# Patient Record
Sex: Female | Born: 1985 | Race: Black or African American | Hispanic: No | Marital: Single | Smoking: Never smoker
Health system: Southern US, Community
[De-identification: ages and names within clinical notes are randomized; demographics above are authoritative.]

## PROBLEM LIST (undated history)

## (undated) DIAGNOSIS — D649 Anemia, unspecified: Secondary | ICD-10-CM

## (undated) DIAGNOSIS — J189 Pneumonia, unspecified organism: Secondary | ICD-10-CM

## (undated) DIAGNOSIS — N390 Urinary tract infection, site not specified: Secondary | ICD-10-CM

---

## 2007-09-03 ENCOUNTER — Emergency Department (HOSPITAL_COMMUNITY): Admission: EM | Admit: 2007-09-03 | Discharge: 2007-09-03 | Payer: Self-pay | Admitting: Emergency Medicine

## 2009-03-12 ENCOUNTER — Emergency Department (HOSPITAL_COMMUNITY): Admission: EM | Admit: 2009-03-12 | Discharge: 2009-03-12 | Payer: Self-pay | Admitting: Emergency Medicine

## 2010-12-23 ENCOUNTER — Ambulatory Visit (HOSPITAL_COMMUNITY)
Admission: RE | Admit: 2010-12-23 | Discharge: 2010-12-23 | Payer: Self-pay | Source: Home / Self Care | Attending: Obstetrics and Gynecology | Admitting: Obstetrics and Gynecology

## 2011-03-09 LAB — COMPREHENSIVE METABOLIC PANEL
BUN: 8 mg/dL (ref 6–23)
CO2: 26 mEq/L (ref 19–32)
Chloride: 109 mEq/L (ref 96–112)
Creatinine, Ser: 0.67 mg/dL (ref 0.4–1.2)
GFR calc non Af Amer: >60 mL/min (ref >60)
Glucose, Bld: 107 mg/dL — ABNORMAL HIGH (ref 70–99)
Total Bilirubin: 0.6 mg/dL (ref 0.3–1.2)

## 2011-03-09 LAB — URINALYSIS, ROUTINE W REFLEX MICROSCOPIC
Ketones, ur: 15 mg/dL — AB
Leukocytes, UA: NEGATIVE
Nitrite: NEGATIVE
Protein, ur: 30 mg/dL — AB
Urobilinogen, UA: 1 mg/dL (ref 0.0–1.0)
pH: 6 (ref 5.0–8.0)

## 2011-03-09 LAB — DIFFERENTIAL
Basophils Absolute: 0 10*3/uL (ref 0.0–0.1)
Basophils Relative: 0 % (ref 0–1)
Eosinophils Relative: 0 % (ref 0–5)
Lymphocytes Relative: 4 % — ABNORMAL LOW (ref 12–46)
Neutro Abs: 5.4 10*3/uL (ref 1.7–7.7)

## 2011-03-09 LAB — URINE MICROSCOPIC-ADD ON

## 2011-03-09 LAB — CBC
HCT: 38.3 % (ref 36.0–46.0)
Hemoglobin: 13 g/dL (ref 12.0–15.0)
MCV: 85.7 fL (ref 78.0–100.0)
RBC: 4.46 MIL/uL (ref 3.87–5.11)
WBC: 6 10*3/uL (ref 4.0–10.5)

## 2011-03-29 DIAGNOSIS — D649 Anemia, unspecified: Secondary | ICD-10-CM

## 2011-03-29 DIAGNOSIS — J189 Pneumonia, unspecified organism: Secondary | ICD-10-CM

## 2011-03-29 HISTORY — DX: Anemia, unspecified: D64.9

## 2011-03-29 HISTORY — DX: Pneumonia, unspecified organism: J18.9

## 2011-04-14 ENCOUNTER — Inpatient Hospital Stay (HOSPITAL_COMMUNITY)
Admission: AD | Admit: 2011-04-14 | Discharge: 2011-04-14 | Disposition: A | Discharge status: Home / Self Care | Payer: Medicaid Other | Source: Ambulatory Visit | Attending: Obstetrics and Gynecology | Admitting: Obstetrics and Gynecology

## 2011-04-14 DIAGNOSIS — R030 Elevated blood-pressure reading, without diagnosis of hypertension: Secondary | ICD-10-CM

## 2011-04-14 DIAGNOSIS — O99891 Other specified diseases and conditions complicating pregnancy: Secondary | ICD-10-CM | POA: Insufficient documentation

## 2011-04-14 DIAGNOSIS — O9989 Other specified diseases and conditions complicating pregnancy, childbirth and the puerperium: Secondary | ICD-10-CM

## 2011-04-14 LAB — COMPREHENSIVE METABOLIC PANEL
ALT: 15 U/L (ref 0–35)
AST: 22 U/L (ref 0–37)
Alkaline Phosphatase: 148 U/L — ABNORMAL HIGH (ref 39–117)
GFR calc Af Amer: >60 mL/min (ref >60)
Glucose, Bld: 82 mg/dL (ref 70–99)
Potassium: 3.6 mEq/L (ref 3.5–5.1)
Sodium: 137 mEq/L (ref 135–145)
Total Protein: 5.9 g/dL — ABNORMAL LOW (ref 6.0–8.3)

## 2011-04-14 LAB — CBC
HCT: 28.2 % — ABNORMAL LOW (ref 36.0–46.0)
Hemoglobin: 8.5 g/dL — ABNORMAL LOW (ref 12.0–15.0)
RDW: 16.3 % — ABNORMAL HIGH (ref 11.5–15.5)
WBC: 10.4 10*3/uL (ref 4.0–10.5)

## 2011-04-14 LAB — URINE MICROSCOPIC-ADD ON

## 2011-04-14 LAB — URINALYSIS, ROUTINE W REFLEX MICROSCOPIC
Glucose, UA: NEGATIVE mg/dL
Hgb urine dipstick: NEGATIVE
Specific Gravity, Urine: >1.03 — ABNORMAL HIGH (ref 1.005–1.030)
Urobilinogen, UA: 2 mg/dL — ABNORMAL HIGH (ref 0.0–1.0)
pH: 6.5 (ref 5.0–8.0)

## 2011-04-14 LAB — LACTATE DEHYDROGENASE: LDH: 195 U/L (ref 94–250)

## 2011-04-17 ENCOUNTER — Inpatient Hospital Stay (HOSPITAL_COMMUNITY)
Admission: AD | Admit: 2011-04-17 | Discharge: 2011-04-21 | DRG: 774 | Disposition: A | Discharge status: Home / Self Care | Payer: Medicaid Other | Source: Ambulatory Visit | Attending: Obstetrics and Gynecology | Admitting: Obstetrics and Gynecology

## 2011-04-17 DIAGNOSIS — IMO0002 Reserved for concepts with insufficient information to code with codable children: Principal | ICD-10-CM | POA: Diagnosis present

## 2011-04-17 LAB — URINALYSIS, DIPSTICK ONLY
Bilirubin Urine: NEGATIVE
Glucose, UA: NEGATIVE mg/dL
Ketones, ur: 15 mg/dL — AB
Nitrite: NEGATIVE
Protein, ur: NEGATIVE mg/dL
Specific Gravity, Urine: 1.025 (ref 1.005–1.030)
Urobilinogen, UA: 1 mg/dL (ref 0.0–1.0)
pH: 6.5 (ref 5.0–8.0)

## 2011-04-17 LAB — COMPREHENSIVE METABOLIC PANEL
AST: 61 U/L — ABNORMAL HIGH (ref 0–37)
Albumin: 2.4 g/dL — ABNORMAL LOW (ref 3.5–5.2)
Alkaline Phosphatase: 145 U/L — ABNORMAL HIGH (ref 39–117)
Alkaline Phosphatase: 150 U/L — ABNORMAL HIGH (ref 39–117)
BUN: 8 mg/dL (ref 6–23)
BUN: 8 mg/dL (ref 6–23)
Chloride: 101 mEq/L (ref 96–112)
Chloride: 100 mEq/L (ref 96–112)
Creatinine, Ser: 0.48 mg/dL (ref 0.4–1.2)
Glucose, Bld: 84 mg/dL (ref 70–99)
Potassium: 5.7 mEq/L — ABNORMAL HIGH (ref 3.5–5.1)
Potassium: 4.1 mEq/L (ref 3.5–5.1)
Sodium: 133 mEq/L — ABNORMAL LOW (ref 135–145)
Total Bilirubin: 0.4 mg/dL (ref 0.3–1.2)
Total Bilirubin: 0.3 mg/dL (ref 0.3–1.2)
Total Protein: 6.4 g/dL (ref 6.0–8.3)

## 2011-04-17 LAB — CBC
Platelets: 320 10*3/uL (ref 150–400)
RBC: 3.69 MIL/uL — ABNORMAL LOW (ref 3.87–5.11)
RDW: 16.5 % — ABNORMAL HIGH (ref 11.5–15.5)
WBC: 9.5 10*3/uL (ref 4.0–10.5)

## 2011-04-18 LAB — CBC
Platelets: 302 10*3/uL (ref 150–400)
RBC: 3.75 MIL/uL — ABNORMAL LOW (ref 3.87–5.11)
RDW: 16.6 % — ABNORMAL HIGH (ref 11.5–15.5)
WBC: 9.8 10*3/uL (ref 4.0–10.5)

## 2011-04-18 LAB — RPR: RPR Ser Ql: NONREACTIVE

## 2011-04-19 LAB — ABO/RH: ABO/RH(D): O POS

## 2011-04-20 LAB — CBC
Hemoglobin: 7.8 g/dL — ABNORMAL LOW (ref 12.0–15.0)
Platelets: 312 10*3/uL (ref 150–400)
RBC: 3.36 MIL/uL — ABNORMAL LOW (ref 3.87–5.11)
WBC: 20.7 10*3/uL — ABNORMAL HIGH (ref 4.0–10.5)

## 2011-04-20 LAB — RPR: RPR Ser Ql: NONREACTIVE

## 2011-04-24 ENCOUNTER — Emergency Department (HOSPITAL_COMMUNITY): Payer: Medicaid Other

## 2011-04-24 ENCOUNTER — Inpatient Hospital Stay (HOSPITAL_COMMUNITY)
Admission: EM | Admit: 2011-04-24 | Discharge: 2011-04-27 | DRG: 195 | Disposition: A | Discharge status: Home / Self Care | Payer: Medicaid Other | Attending: Internal Medicine | Admitting: Internal Medicine

## 2011-04-24 DIAGNOSIS — I10 Essential (primary) hypertension: Secondary | ICD-10-CM | POA: Diagnosis present

## 2011-04-24 DIAGNOSIS — R0609 Other forms of dyspnea: Secondary | ICD-10-CM | POA: Diagnosis present

## 2011-04-24 DIAGNOSIS — R0602 Shortness of breath: Secondary | ICD-10-CM

## 2011-04-24 DIAGNOSIS — J189 Pneumonia, unspecified organism: Principal | ICD-10-CM | POA: Diagnosis present

## 2011-04-24 DIAGNOSIS — D649 Anemia, unspecified: Secondary | ICD-10-CM | POA: Diagnosis present

## 2011-04-24 DIAGNOSIS — R0989 Other specified symptoms and signs involving the circulatory and respiratory systems: Secondary | ICD-10-CM | POA: Diagnosis present

## 2011-04-24 LAB — CBC
HCT: 27.9 % — ABNORMAL LOW (ref 36.0–46.0)
Hemoglobin: 8.7 g/dL — ABNORMAL LOW (ref 12.0–15.0)
MCH: 23.6 pg — ABNORMAL LOW (ref 26.0–34.0)
MCHC: 31.2 g/dL (ref 30.0–36.0)
MCV: 75.6 fL — ABNORMAL LOW (ref 78.0–100.0)
RBC: 3.69 MIL/uL — ABNORMAL LOW (ref 3.87–5.11)

## 2011-04-24 LAB — URINE MICROSCOPIC-ADD ON

## 2011-04-24 LAB — DIFFERENTIAL
Basophils Relative: 0 % (ref 0–1)
Lymphocytes Relative: 7 % — ABNORMAL LOW (ref 12–46)
Lymphs Abs: 0.9 10*3/uL (ref 0.7–4.0)
Monocytes Absolute: 1 10*3/uL (ref 0.1–1.0)
Monocytes Relative: 7 % (ref 3–12)
Neutro Abs: 11.8 10*3/uL — ABNORMAL HIGH (ref 1.7–7.7)
Neutrophils Relative %: 86 % — ABNORMAL HIGH (ref 43–77)

## 2011-04-24 LAB — BASIC METABOLIC PANEL
BUN: 13 mg/dL (ref 6–23)
CO2: 22 mEq/L (ref 19–32)
Calcium: 9.2 mg/dL (ref 8.4–10.5)
Chloride: 101 mEq/L (ref 96–112)
Creatinine, Ser: 0.54 mg/dL (ref 0.4–1.2)
Glucose, Bld: 88 mg/dL (ref 70–99)

## 2011-04-24 LAB — URINALYSIS, ROUTINE W REFLEX MICROSCOPIC
Glucose, UA: NEGATIVE mg/dL
Ketones, ur: NEGATIVE mg/dL
Protein, ur: NEGATIVE mg/dL
pH: 6.5 (ref 5.0–8.0)

## 2011-04-24 LAB — CARDIAC PANEL(CRET KIN+CKTOT+MB+TROPI)
CK, MB: 3.5 ng/mL (ref 0.3–4.0)
Total CK: 354 U/L — ABNORMAL HIGH (ref 7–177)

## 2011-04-25 LAB — VANCOMYCIN, TROUGH: Vancomycin Tr: 14 ug/mL (ref 10.0–20.0)

## 2011-04-25 LAB — COMPREHENSIVE METABOLIC PANEL
ALT: 17 U/L (ref 0–35)
AST: 19 U/L (ref 0–37)
CO2: 25 mEq/L (ref 19–32)
Chloride: 105 mEq/L (ref 96–112)
GFR calc Af Amer: >60 mL/min (ref >60)
GFR calc non Af Amer: >60 mL/min (ref >60)
Glucose, Bld: 84 mg/dL (ref 70–99)
Sodium: 139 mEq/L (ref 135–145)
Total Bilirubin: 0.3 mg/dL (ref 0.3–1.2)

## 2011-04-25 LAB — MAGNESIUM: Magnesium: 2.1 mg/dL (ref 1.5–2.5)

## 2011-04-25 LAB — URINE CULTURE: Culture  Setup Time: 201205271123

## 2011-04-25 LAB — CBC
Hemoglobin: 8.2 g/dL — ABNORMAL LOW (ref 12.0–15.0)
MCH: 23 pg — ABNORMAL LOW (ref 26.0–34.0)
MCHC: 29.9 g/dL — ABNORMAL LOW (ref 30.0–36.0)
MCV: 76.8 fL — ABNORMAL LOW (ref 78.0–100.0)
Platelets: 372 10*3/uL (ref 150–400)
RBC: 3.57 MIL/uL — ABNORMAL LOW (ref 3.87–5.11)

## 2011-04-25 NOTE — Discharge Summary (Signed)
  NAMEBREANNA, [name]             ACCOUNT NO.:  [id number]  MEDICAL RECORD NO.:  [id number]           PATIENT TYPE:  I  LOCATION:  9104                          [facility]ACILITY:  [facility]  PHYSICIAN:  [name], M.D.DATE OF BIRTH:  [birth year]  DATE OF ADMISSION:  04/17/2011 DATE OF DISCHARGE:  04/21/2011                              DISCHARGE SUMMARY   ADMISSION DIAGNOSIS:  Intrauterine pregnancy at 40 weeks with gestational hypertension.  DISCHARGE DIAGNOSIS:  Intrauterine pregnancy at 40 weeks with gestational hypertension.  PROCEDURES:  On May 22, she had a spontaneous vaginal delivery.  HISTORY AND PHYSICAL:  This is a 25 year old gravida 2, para 0-0-1-0 at 40 plus weeks who is admitted by Dr. Ellyn Keller for cervical ripening and induction for term status with gestational hypertension.  Prenatal care complicated by elevated blood pressures and mild proteinuria.  PAST HISTORY:  Significant only for morbid obesity.  OB HISTORY:  Significant for one spontaneous abortion.  PHYSICAL EXAMINATION:  VITAL SIGNS:  Weight is 300+ pounds, blood pressure is slightly elevated. ABDOMEN:  Obese, nontender, and gravid.  Cervix is closed 50 and -3.  HOSPITAL COURSE:  The patient was admitted on the evening of May 20 and had Cervidil placed for cervical ripening.  On morning of May 21, Dr. Ellyn Keller evaluated her.  Cervix was still closed, thick, and high blood pressures 150/90s and a second Cervidil was placed.  On the evening of May 21, she progressed to 280 and -2.  Membranes were ruptured revealing clear fluid and internal monitors were placed and she was started on Pitocin.  Blood pressures remained stable and she was not placed on magnesium.  Throughout the night on the 21st and end of the morning of 22nd, she gradually entered active labor, reached complete, pushed well, and on the early afternoon of May 22 had a vaginal delivery of a viable female infant with Apgar's of 8 and 9 and weight  7 pounds 10 ounces. She delivered through a loose nuchal cord x1 and there was moderate meconium.  Placenta delivered spontaneous was intact.  She had a second- degree laceration repaired with 3-0 Vicryl with added local block and estimated blood loss was 500 mL.  Postpartum, she had no significant complications.  Blood pressure improved.  Predelivery hemoglobin 8.7, postdelivery 7.8.  On postpartum day #2, she was felt to be stable enough for discharge home.  DISCHARGE INSTRUCTIONS:  Regular diet, pelvic rest, follow up in 6 weeks.  MEDICATIONS:  Percocet #20 one to two p.o. q.4-6 h. p.r.n. pain and over- the-counter ibuprofen as needed and she is given our discharge pamphlet.     [name], M.D.     [name]  D:  04/21/2011  T:  04/21/2011  Job:  [id number]  Electronically Signed by [name] M.D. on 04/25/2011 10:19:26 AM

## 2011-04-26 LAB — IRON AND TIBC
Iron: 27 ug/dL — ABNORMAL LOW (ref 42–135)
Saturation Ratios: 6 % — ABNORMAL LOW (ref 20–55)
TIBC: 472 ug/dL — ABNORMAL HIGH (ref 250–470)

## 2011-04-26 LAB — CBC
Hemoglobin: 8.1 g/dL — ABNORMAL LOW (ref 12.0–15.0)
MCHC: 30.3 g/dL (ref 30.0–36.0)
RBC: 3.43 MIL/uL — ABNORMAL LOW (ref 3.87–5.11)
WBC: 9.6 10*3/uL (ref 4.0–10.5)

## 2011-04-26 LAB — FERRITIN: Ferritin: 14 ng/mL (ref 10–291)

## 2011-04-26 NOTE — H&P (Signed)
NAMECHRISTYANNA, [name]             ACCOUNT NO.:  [id number]  MEDICAL RECORD NO.:  [id number]           PATIENT TYPE:  E  LOCATION:  [facility]                         FACILITY:  [facility]  PHYSICIAN:  Hilary Hertz, MD      DATE OF BIRTH:  05-23-1986  DATE OF ADMISSION:  04/24/2011 DATE OF DISCHARGE:                             HISTORY & PHYSICAL   PRIMARY OB PHYSICIAN:  Sherron Monday, MD  CHIEF COMPLAINT:  Shortness of breath.  HISTORY OF PRESENT ILLNESS:  The patient is a 25 year old woman with obesity who delivered a child on Apr 19, 2011, (5 days ago) who presents with 24 hours of shortness of breath.  The patient was discharged from the hospital on Apr 21, 2011.  She had been doing well up until about 12 p.m. yesterday where she reports she has been increasingly short of breath and had been coughing up "mucus."  The patient reports that she also felt hot, but did not have any documented fevers.  She denies dyspnea on exertion, PND, or orthopnea.  She has lower extremity edema, which is chronic for the past 9 months and unchanged.Marland Kitchen  She was treated for preeclampsia during her delivery.  She is also finding it very hard to sleep due to her symptoms.  REVIEW OF SYSTEMS:  Review of 10-organ systems was done and is negative except as stated above in the HPI.  ALLERGIES:  No known drug allergies.  MEDICATIONS:  The patient is currently not taking any medications.  PAST MEDICAL HISTORY:  None.  PAST SURGICAL HISTORY:  The patient had normal spontaneous vaginal delivery on Apr 19, 2011.  SOCIAL HISTORY:  The patient does not smoke, drink, or use any illicit drugs.  FAMILY HISTORY:  No family history of heart disease.  PHYSICAL EXAMINATION:  VITALS:  Blood pressure 163/96, pulse 113, respirations 36, temperature 98.7. GENERAL:  The patient is in mild respiratory distress.  It is difficult for her to form full sentences due to shortness of breath. HEENT:  Mucous membranes  moist. NECK:  JVP is at the level of the clavicle at about 35 degrees. CV:  Tachy.  No murmurs. LUNGS:  Scattered rhonchi throughout.  Tachypneic.  Mildly labored respiratory effort. ABDOMEN:  Soft, nontender, nondistended. EXTREMITIES:  Trace to 1+ bilateral lower extremity edema. SKIN:  No rashes. NEURO:  Nonfocal.  LABORATORY DATA:  White count 13.8, hemoglobin 8.7, platelets 373. Sodium 135, potassium 3.7, chloride 101, bicarb 22, glucose 88, BUN 13, creatinine 0.5.  BNP is 463.  UA:  Large leukocyte esterase, negative nitrite, large blood.  Microscopic:  11-20 wbc's, 21-50 rbc's.  Chest x- ray shows dense fluffy airspace opacification of the lung bases, more prominent on the right, bilateral lower lobe pneumonia, and mild vascular congestion.  EKG:  Sinus, rate 84, no acute ischemic changes.  ASSESSMENT AND PLAN:  The patient is a 25 year old woman with recent spontaneous vaginal delivery 5 days ago who presents with shortness of breath. 1. Shortness of breath is likely due to the patient's bilateral lower     lobe pneumonia, seen on chest x-ray.  Given the patient's recent 4-  day hospitalization for the delivery of her child, this is     considered hospital-acquired pneumonia, and the patient required     treatment with vancomycin and Zosyn.  She has already received     ceftriaxone and azithromycin in the emergency department.  I have     discussed this extensively with her primary obstetrics physician,     Dr. Ellyn Hack; and at this time, we will put the patient on vancomycin     and Zosyn, but this will preclude the patient from breast feeding.     The patient is agreeable to this, and at this time, reports that     she was thinking about discontinuing breast feeding before this     event anyway.  I think that the patient needs broad-spectrum     antibiotics given the extent of her pneumonia on x-ray and her     recent hospitalization putting her at risk for  multidrug-resistant     organisms.  Can also consider the diagnosis of pulmonary embolism     in this patient given her peripartum status.  This could also be peripartum cardiomyopathy, an echo is ordered.  Her BNP is slightly elevated.  Give supplemental O2 as needed 2. Pulmonary congestion.  On the x-ray, this is likely contributing to     the patient's shortness of breath and presentation.  She also has     an elevated BNP.  Constellation of findings are concerning for     possible peripartum cardiomyopathy.  Her cardiac silhouette is     borderline enlarged on her chest x-ray as well.  We will order an     echocardiogram and give the patient empiric small dose of 20 mg IV     Lasix and assess for response and help with her breathing and     respiratory distress. 3. Hypertension.  The patient has a history of gestational     hypertension and preeclampsia.  I have also discussed this with her     primary obstetrics physician, Dr. Ellyn Hack; and at this time, the     patient does not have protein in her urine.  We will check LFTs.     If there is any issue such as seizure, the patient should receive     magnesium.  At this time, we will continue to monitor her blood     pressure and consider starting lisinopril as ACE inhibitors are     safe postpartum. 4. Breast feeding.  As above, the patient needs many treatments at     this time that is unclear how these medications affect breast     feeding.  We will have Pharmacy review her medications.  I have     discussed this extensively with her obstetric physician, Dr. Ellyn Hack     who will have lactation specialist come by and discuss with the     patient.  She may be able to "pump and dump" if she wishes to     continue to breast feed.  At this time, the patient does not feel     strongly about continuing the breast feed, and she will feed her     child with formula.  She may resume breast feeding if she would     like once that the treatment of  her acute issues is complete.  Dr.     Ellyn Hack will see the patient in consultation officially later today. 5. Fluid, electrolytes, nutrition.  Hep-Lock  IV.  Replace electrolytes     as needed.  Put the patient on a regular diet. 6. Prophylaxis.  Lovenox for deep vein thrombosis prophylaxis.  DISPOSITION:  We will admit the patient to [facility] Team 3 for further evaluation and treatment.  Dr. Ellyn Hack from Obstetrics and Gynecology will see the patient later today.          ______________________________ Hilary Hertz, MD    JF/MEDQ  D:  04/24/2011  T:  04/24/2011  Job:  [id number]  Electronically Signed by Hilary Hertz MD on 04/26/2011 06:34:57 PM

## 2011-04-28 NOTE — H&P (Signed)
NAMEEMY, [name]             ACCOUNT NO.:  [id number]  MEDICAL RECORD NO.:  [id number]           PATIENT TYPE:  O  LOCATION:  [facility]                         [facility]AC[facility]  PHYSICIAN:  Sherron Monday, MD        DATE OF BIRTH:  1986/05/31  DATE OF ADMISSION:  04/14/2011 DATE OF DISCHARGE:  04/14/2011                             HISTORY & PHYSICAL   ADMISSION DIAGNOSIS:  Intrauterine pregnancy at 40 weeks with preeclampsia and unfavorable cervix.  PROCEDURE PLANNED:  Induction of labor with Cervidil and AROM and Pitocin.  HISTORY OF PRESENT ILLNESS:  This is a 25 year old G2, P 0-0-1-0 at 40+ weeks for induction of labor given preeclampsia and post-term status. She is somewhat late entry to prenatal care at 15 weeks.  Her blood pressures have been elevated for several weeks and has mild proteinuria of 1+ in the office.  Otherwise, she has had an uncomplicated prenatal course.  Please see her hospital records for complete history.  We discussed at length induction of labor for this patient given her unfavorable cervix status.  She voices understanding and wishes to proceed.  PAST MEDICAL HISTORY:  Not significant except for morbid obesity.  PAST SURGICAL HISTORY:  Not significant.  PAST OB/GYN HISTORY:  She is a G2, P 0-0-1-0 with G1 being a miscarriage in 2007.  She has a history of an abnormal Pap smear with no followup and no history of any sexually transmitted diseases.  MEDICATIONS:  Prenatal vitamins.  ALLERGIES:  No known drug allergies.  No latex allergies.  SOCIAL HISTORY:  Denies alcohol, tobacco, or drug use.  She is in college and single.  FAMILY HISTORY:  Significant for breast cancer, colon cancer, diabetes, heart disease, hypertension, and migraine.  PRENATAL LABORATORY DATA:  O+.  Antibody screen negative.  Hemoglobin 1.6.  Pap smear within normal limits.  Rubella immune.  RPR nonreactive. Hepatitis B surface antigen negative.  HIV negative.  Platelets  305,000. Varicella IgG equivocal.  Hemoglobin electrophoresis within normal limits.  Gonorrhea negative.  Chlamydia negative.  Cystic fibrosis screen negative.  AFP within normal limits.  Glucola of 104.  Group B strep is negative.  Her ultrasound performed at 15 weeks revealed limited normal anatomy and plan was to repeat it in 3-4 with an [facility] of Apr 15, 2011.  Followup anatomy was again limited.  She followed up with Maternal Fetal Medicine and had a normal anatomy scan.  PHYSICAL EXAMINATION:  VITAL SIGNS:  Afebrile, elevated blood pressure. GENERAL:  No apparent distress. CARDIOVASCULAR:  Regular rate and rhythm. LUNGS:  Clear to auscultation bilaterally. ABDOMEN:  Soft, obese, and nontender. EXTREMITIES:  Symmetric and nontender having mild edema. PELVIS:  Her cervix is 0-hour closed, 50% effaced and -3 station.  Fetal heart tones in the office were 150s with her fundal height measuring mildly ahead at 41 weeks.  ASSESSMENT AND PLAN:  This is a 25 year old G2, P 0-0-1-0 at 40+ weeks for induction of labor given postdate status as well as preeclampsia. She voices understanding to risks, benefits, and alternatives to labor induction.  She will receive Cervidil and then artificial rupture of  membranes and Pitocin.  She is group B strep positive.     Sherron Monday, MD     JB/MEDQ  D:  04/15/2011  T:  04/15/2011  Job:  [id number]  Electronically Signed by Sherron Monday MD on 04/28/2011 10:41:28 AM

## 2011-04-29 NOTE — Consult Note (Signed)
[name], [name]             ACCOUNT NO.:  [id number]  MEDICAL RECORD NO.:  [id number]           PATIENT TYPE:  I  LOCATION:  3705                         FACILITY:  [facility]  PHYSICIAN:  Madolyn Frieze. Jens Som, MD, FACCDATE OF BIRTH:  12-10-1985  DATE OF CONSULTATION:  04/24/2011 DATE OF DISCHARGE:                                CONSULTATION   [name] is a very pleasant 25 year old female with no prior cardiac history who I am asked to evaluate for dyspnea.  The patient delivered her first child on May 22.  She states that she did have pedal edema bilaterally in the last trimester .  She also had fatigue but denied any dyspnea on exertion, orthopnea or PND.  She delivered on May 22 and did well for several days other than fatigue.  She began coughing yesterday at noon.  The cough was productive of a yellow sputum.  She also had subjective fever and developed dyspnea on exertion.  She denied orthopnea, PND or pedal edema.  She has been admitted by the primary care service and is being treated for pneumonia.  There is also a question of postpartum cardiomyopathy and Cardiology is asked to further evaluate.  Note, the patient also had 5 minutes of "medium" chest pain at the time of her delivery.  It increased with certain movements and she has had none since.  Her present medications include azithromycin and Rocephin that she received x1 in the emergency room.  She is on 40 mg of enoxaparin subcu daily.  She received Lasix x1.  She is on vancomycin and Zosyn.  She has no known drug allergies.  SOCIAL HISTORY:  She does not smoke.  She does not consume alcohol.  Her family history is negative for coronary artery disease.  Her past medical history, there is no diabetes mellitus, hypertension or hyperlipidemia.  She has had no previous surgeries.  REVIEW OF SYSTEMS:  She denies any headaches but she has had a subjective fever.  There has been no chills.  She has had a  productive cough.  There is no hemoptysis.  There is no dysphagia, odynophagia, melena or hematochezia.  There is no dysuria.  There is no orthopnea or PND and her pedal edema has resolved since delivery.  The remaining systems are negative.  PHYSICAL EXAMINATION:  VITAL SIGNS:  Today shows a temperature of 99.9. Her blood pressure is 152/97.  Her pulse is 94. GENERAL:  She is well developed and obese.  She is in no acute distress at present.  Her skin is warm and dry.  She does not appear to be depressed.  There is no peripheral clubbing. BACK:  Normal. HEENT:  Normal with normal eyelids. NECK:  Supple with a normal upstroke bilaterally.  No bruits noted. There is no thyromegaly and I cannot appreciate bruits.  Her jugular venous distention is difficult to assess. CHEST:  Crackles in the right lower lobe. CARDIOVASCULAR:  Regular rate and rhythm with normal S1 and S2.  I cannot appreciate murmurs, rubs or gallops. ABDOMEN:  Nontender, nondistended.  Positive bowel sounds.  No hepatosplenomegaly.  No mass appreciated.  There is no abdominal bruit. She has 2+ femoral pulses bilaterally.  No bruits. EXTREMITIES:  No edema.  I can palpate no cords.  She has 2+ dorsalis pedis pulses bilaterally. NEUROLOGIC:  Grossly intact.  Her laboratories show a chest x-ray that reveals bilateral lower lobe pneumonia and mild vascular congestion.  Her BNP is mildly elevated at 464.  Her sodium is 135, potassium 3.7.  BUN and creatinine are 13 and 0.54.  White blood cell count is 13.8 with a hemoglobin of 8.7, hematocrit 27.9.  Platelet count is 373.  Her electrocardiogram shows a sinus rhythm at a rate of 84.  There were no ST changes.  DIAGNOSIS:  Dyspnea - this appears to be most likely secondary to pneumonia given her mildly elevated white blood cell count, chest x-ray findings and productive cough.  She is being treated with antibiotics per the primary care service.  Her BNP is mildly elevated  but her pedal edema is resolving.  She may have mild volume excess from her recent pregnancy.  Certainly, peripartum cardiomyopathy is also in the differential.  We will await her echocardiogram.  I think if her LV function is normal then I would recommend continuing therapy for her pneumonia.  Note, she was given Lasix 20 mg intravenously by the primary care service.  Her blood pressure is also mildly elevated and this will be followed.  If she requires therapy for this, I would not use an ACE inhibitor as she does plan to breastfeed.  We will be happy to follow while she is in the hospital.     Madolyn Frieze. Jens Som, MD, [facility]     BSC/MEDQ  D:  04/24/2011  T:  04/24/2011  Job:  [id number]  Electronically Signed by Olga Millers MD [facility] on 04/29/2011 04:54:32 PM

## 2011-04-30 LAB — CULTURE, BLOOD (ROUTINE X 2)
Culture  Setup Time: 201205271418
Culture: NO GROWTH

## 2011-06-29 NOTE — Discharge Summary (Signed)
[name], [name] NO.:  [id number]  MEDICAL RECORD NO.:  [id number]  LOCATION:  3705                         FACILITY:  [facility]  PHYSICIAN:  Calvert Cantor, M.D.     DATE OF BIRTH:  09/09/1986  DATE OF ADMISSION:  04/24/2011 DATE OF DISCHARGE:  04/27/2011                              DISCHARGE SUMMARY   PRIMARY CARE PHYSICIAN:  Sherron Monday, MD  PRESENTING COMPLAINT:  Shortness of breath.  DISCHARGE DIAGNOSES: 1. Pneumonia, healthcare-acquired. 2. Hypertension. 3. Pedal edema on admission. 4. Microcytic anemia due to iron deficiency. 5. Morbid obesity. 6. Anemia of iron deficiency. 7. Vitamin B12 deficiency.  DISCHARGE MEDICATIONS: 1. Augmentin 875 one tablet twice a day. 2. Cyanocobalamin 1000 mcg daily. 3. Doxycycline 100 mg twice a day. 4. Ferrous sulfate 325 mg twice a day before meals. 5. Senna 2 tablets daily as needed for constipation. 6. Prenatal vitamin 1 tablet daily.  PROCEDURES: 1. Chest x-ray two-view on Apr 24, 2011 revealed dense flow free     airspace opacification at the lung bases, more prominent on the     right compatible with bilateral lower lobe pneumonia and mild     vascular congestion. 2. A 2-D echo performed Apr 26, 2011, LV cavity, normal systolic     function, normal EF 60-65%.  No regional wall motion abnormalities.     E/e ratio is greater than 10 suggesting elevated LV filling     pressures.  LV diastolic parameters were normal.  There is trace     mitral regurgitation.  PA pressure is less than 39.  Tricuspid jet     envelope was incomplete.  Inferior vena cava is normal in size.     The respirophasic diameter changes were in the normal range     equalling 50%.  Findings are consistent with normal central venous     pressure.  There was no pericardial effusion.  HOSPITAL COURSE:  This is a 25 year old female who delivered child 5 days ago and presented to the ER with a complaint of shortness of breath.  She  admitted to coughing up mucus.  She felt hot but did not have any documented fevers.  She also complained of lower extremity edema, which she had been dealing with for the past 9 months.  She was treated for preeclampsia during her delivery.  The patient was hospitalized due and started on vancomycin and Zosyn for healthcare-acquired pneumonia.  She progressed well during the hospital stay and shortness of breath improved.  Cardiology consult was requested and 2-D echo was done.  Cardiology did not suspect that her dyspnea was related to congestive heart failure.  Blood pressure was noted to be mildly elevated throughout her stay but no treatment was initiated for this as it was mild.  She was found to be anemic and deficient in iron.  Hemoglobin was noted to be 8.7.  Iron level was low at 27, iron saturation was 6, iron binding was 427 which is high, ferritin was 14.  The patient has been started on above-mentioned iron.  She was also found to be deficient in vitamin B12 level was 129.  On discharge, the patient is doing well  and is now requiring oxygen. Lungs clear bilaterally.  Normal respiratory effort.  Heart, regular rate and rhythm.  No murmurs.  Abdomen, soft, nontender.  Bowel sounds positive.  Extremities, no longer has any edema.  No cyanosis or clubbing.  Condition on discharge is stable.  Follow up with primary care physician in 1-2 weeks.  Follow up with OB per appointment.     Calvert Cantor, M.D.     SR/MEDQ  D:  06/28/2011  T:  06/28/2011  Job:  [id number]  cc:   Sherron Monday, MD  Electronically Signed by Calvert Cantor M.D. on 06/29/2011 07:17:29 AM

## 2011-09-08 LAB — URINE MICROSCOPIC-ADD ON

## 2011-09-08 LAB — POCT PREGNANCY, URINE: Preg Test, Ur: NEGATIVE

## 2011-09-08 LAB — URINALYSIS, ROUTINE W REFLEX MICROSCOPIC
Bilirubin Urine: NEGATIVE
Glucose, UA: NEGATIVE
Nitrite: NEGATIVE
Specific Gravity, Urine: 1.019
pH: 7

## 2011-09-08 LAB — URINE CULTURE

## 2011-10-08 ENCOUNTER — Emergency Department (HOSPITAL_COMMUNITY)
Admission: EM | Admit: 2011-10-08 | Discharge: 2011-10-08 | Disposition: A | Discharge status: Home / Self Care | Payer: Medicaid Other | Attending: Emergency Medicine | Admitting: Emergency Medicine

## 2011-10-08 ENCOUNTER — Emergency Department (HOSPITAL_COMMUNITY): Payer: Medicaid Other

## 2011-10-08 ENCOUNTER — Encounter: Payer: Self-pay | Admitting: *Deleted

## 2011-10-08 DIAGNOSIS — R1011 Right upper quadrant pain: Secondary | ICD-10-CM | POA: Insufficient documentation

## 2011-10-08 DIAGNOSIS — R110 Nausea: Secondary | ICD-10-CM | POA: Insufficient documentation

## 2011-10-08 DIAGNOSIS — R630 Anorexia: Secondary | ICD-10-CM | POA: Insufficient documentation

## 2011-10-08 DIAGNOSIS — R109 Unspecified abdominal pain: Secondary | ICD-10-CM

## 2011-10-08 DIAGNOSIS — E669 Obesity, unspecified: Secondary | ICD-10-CM | POA: Insufficient documentation

## 2011-10-08 DIAGNOSIS — K8020 Calculus of gallbladder without cholecystitis without obstruction: Secondary | ICD-10-CM | POA: Insufficient documentation

## 2011-10-08 DIAGNOSIS — R10819 Abdominal tenderness, unspecified site: Secondary | ICD-10-CM | POA: Insufficient documentation

## 2011-10-08 HISTORY — DX: Pneumonia, unspecified organism: J18.9

## 2011-10-08 LAB — URINE MICROSCOPIC-ADD ON

## 2011-10-08 LAB — COMPREHENSIVE METABOLIC PANEL
Alkaline Phosphatase: 73 U/L (ref 39–117)
BUN: 11 mg/dL (ref 6–23)
Chloride: 105 mEq/L (ref 96–112)
GFR calc Af Amer: >90 mL/min (ref >90)
GFR calc non Af Amer: >90 mL/min (ref >90)
Glucose, Bld: 96 mg/dL (ref 70–99)
Potassium: 3.8 mEq/L (ref 3.5–5.1)
Total Bilirubin: 0.2 mg/dL — ABNORMAL LOW (ref 0.3–1.2)

## 2011-10-08 LAB — URINALYSIS, ROUTINE W REFLEX MICROSCOPIC
Ketones, ur: NEGATIVE mg/dL
Ketones, ur: NEGATIVE mg/dL
Leukocytes, UA: NEGATIVE
Nitrite: NEGATIVE
Nitrite: NEGATIVE
Protein, ur: 30 mg/dL — AB
Specific Gravity, Urine: 1.027 (ref 1.005–1.030)
Urobilinogen, UA: 1 mg/dL (ref 0.0–1.0)
pH: 7 (ref 5.0–8.0)

## 2011-10-08 LAB — CBC
Hemoglobin: 11.4 g/dL — ABNORMAL LOW (ref 12.0–15.0)
MCH: 26.4 pg (ref 26.0–34.0)
RBC: 4.32 MIL/uL (ref 3.87–5.11)

## 2011-10-08 LAB — DIFFERENTIAL
Eosinophils Absolute: 0.1 10*3/uL (ref 0.0–0.7)
Lymphs Abs: 1.2 10*3/uL (ref 0.7–4.0)
Monocytes Relative: 9 % (ref 3–12)
Neutrophils Relative %: 64 % (ref 43–77)

## 2011-10-08 MED ORDER — FENTANYL CITRATE 0.05 MG/ML IJ SOLN
INTRAMUSCULAR | Status: AC
  Filled 2011-10-08: qty 2

## 2011-10-08 MED ORDER — SODIUM CHLORIDE 0.9 % IV BOLUS (SEPSIS)
500.0000 mL | Freq: Once | INTRAVENOUS | Status: AC
  Administered 2011-10-08: 09:00:00 via INTRAVENOUS

## 2011-10-08 MED ORDER — ONDANSETRON HCL 4 MG/2ML IJ SOLN
4.0000 mg | Freq: Once | INTRAMUSCULAR | Status: AC
  Administered 2011-10-08: 4 mg via INTRAVENOUS

## 2011-10-08 MED ORDER — ONDANSETRON HCL 4 MG/2ML IJ SOLN
INTRAMUSCULAR | Status: AC
  Filled 2011-10-08: qty 2

## 2011-10-08 MED ORDER — FENTANYL CITRATE 0.05 MG/ML IJ SOLN
50.0000 ug | Freq: Once | INTRAMUSCULAR | Status: AC
  Administered 2011-10-08: 50 ug via INTRAVENOUS
  Filled 2011-10-08: qty 2

## 2011-10-08 MED ORDER — FENTANYL CITRATE 0.05 MG/ML IJ SOLN
50.0000 ug | Freq: Once | INTRAMUSCULAR | Status: AC
  Administered 2011-10-08: 50 ug via INTRAVENOUS

## 2011-10-08 MED ORDER — SODIUM CHLORIDE 0.9 % IV SOLN: INTRAVENOUS | Status: DC

## 2011-10-08 MED ORDER — ONDANSETRON HCL 4 MG/2ML IJ SOLN
4.0000 mg | Freq: Once | INTRAMUSCULAR | Status: AC
  Administered 2011-10-08: 4 mg via INTRAVENOUS
  Filled 2011-10-08: qty 2

## 2011-10-08 NOTE — ED Notes (Signed)
Pt states she is hurting in her rt upper stomach, some  Nausea,

## 2011-10-08 NOTE — ED Notes (Signed)
Pt presenting to ed with c/o abdominal pain. Pt states positive nausea no vomiting. Pt states she has right side pain as well. Pt states onset 12:00am. Pt denies diarrhea at this time.

## 2011-10-08 NOTE — ED Notes (Signed)
Pt to ultrasound

## 2011-10-08 NOTE — ED Notes (Signed)
Pt medicated per md order for nausea

## 2011-10-08 NOTE — Consult Note (Signed)
Reason for Consult:  abdominal pain and gallstones Referring Physician: Dr. Rubin Payor, EDP  [name] is an 25 y.o. female.  HPI: At midnight, she began having pressure-type epigastric and right upper quadrant pain. This was associated with nausea and vomiting. The pain persisted. She presented to the emergency department for evaluation. She was given pain medicine and medicine for nausea and currently she feels better. She is found to have gallbladder disease, specifically gallstones, on her ultrasound. No evidence of acute cholecystitis. I was asked to see her because of this. No fever. No diarrhea.  She is 6 months postpartum. He began having symptoms like this but less severe postpartum.  Past Medical History  Diagnosis Date  . Pneumonia     bilateral, postpartum    History reviewed. No pertinent past surgical history.  Allergies: No Known Allergies  Medications: Prior to Admission:  (Not in a hospital admission)  Family History  Problem Relation Age of Onset  . Hypertension Mother     Social History:  reports that she has never smoked. She does not have any smokeless tobacco history on file. She reports that she does not drink alcohol or use illicit drugs.  ROS General:  Negative  Breast:  NA  Infectious Diseases: Negative  Cardiac  :  Negative  Pulmonary:  Negative  Endocrine:  Negative  Skin:  Negative  Gastrointestinal:  Negative  Genitourinary:  Negative  Neurological:  Negative  Hematologic/Lymphatic:  Negative  HEENT:  Negative  Musculoskeletal:  Negative   Blood pressure 137/81, pulse 62, temperature 98.3 F (36.8 C), temperature source Oral, resp. rate 20, last menstrual period 10/08/2011, SpO2 100.00%.  BP 137/81  Pulse 62  Temp(Src) 98.3 F (36.8 C) (Oral)  Resp 20  SpO2 100%  LMP 10/08/2011  PE        General Appearance:  Alert, cooperative, no distress, appears stated age, obese  Head:  Normocephalic, without obvious  abnormality, atraumatic  Eyes:   Conjunctiva/corneas clear, EOM's intact      Nose: Nares normal, no drainage   Mouth: Mucous membranes moist  Neck: Supple, symmetrical, trachea midline, no tenderness/mass/nodules, no JVD  Back:   Symmetric, no curvature, no CVA tenderness  Lungs:   Clear to auscultation bilaterally, respirations unlabored  Chest Wall:  No tenderness or deformity  Heart:  Regular rate and rhythm, S1, S2 normal, no murmur, rub or gallop  Abdomen:   Soft, non-tender, bowel sounds active all four quadrants,  no masses, no organomegaly, no scars  GU:  No masses, no hernias  Rectal:  Normal tone, normal prostate, no masses or tenderness; guaiac negative stool  Extremities: Extremities normal, atraumatic, no cyanosis or edema     Skin: Skin color, texture, turgor normal, no rashes or lesions  Lymph nodes: No enlarged cervical or supraclavicular nodes                                  Neuro:  Alert and oriented, no focal deficits          Results for orders placed during the hospital encounter of 10/08/11 (from the past 48 hour(s))  URINALYSIS, ROUTINE W REFLEX MICROSCOPIC     Status: Abnormal   Collection Time   10/08/11  8:51 AM      Component Value Range Comment   Color, Urine YELLOW  YELLOW     Appearance CLOUDY (*) CLEAR     Specific Gravity, Urine  1.030  1.005 - 1.030     pH 7.0  5.0 - 8.0     Glucose, UA NEGATIVE  NEGATIVE (mg/dL)    Hgb urine dipstick LARGE (*) NEGATIVE     Bilirubin Urine NEGATIVE  NEGATIVE     Ketones, ur NEGATIVE  NEGATIVE (mg/dL)    Protein, ur 30 (*) NEGATIVE (mg/dL)    Urobilinogen, UA 1.0  0.0 - 1.0 (mg/dL)    Nitrite NEGATIVE  NEGATIVE     Leukocytes, UA SMALL (*) NEGATIVE    PREGNANCY, URINE     Status: Normal   Collection Time   10/08/11  8:51 AM      Component Value Range Comment   Preg Test, Ur NEGATIVE     URINE MICROSCOPIC-ADD ON     Status: Abnormal   Collection Time   10/08/11  8:51 AM      Component Value Range  Comment   Squamous Epithelial / LPF MANY (*) RARE     WBC, UA 7-10  <3 (WBC/hpf)    RBC / HPF TOO NUMEROUS TO COUNT  <3 (RBC/hpf)    Bacteria, UA RARE  RARE     Urine-Other MUCOUS PRESENT     CBC     Status: Abnormal   Collection Time   10/08/11  9:28 AM      Component Value Range Comment   WBC 4.7  4.0 - 10.5 (K/uL)    RBC 4.32  3.87 - 5.11 (MIL/uL)    Hemoglobin 11.4 (*) 12.0 - 15.0 (g/dL)    HCT 47.8 (*) 29.5 - 46.0 (%)    MCV 82.4  78.0 - 100.0 (fL)    MCH 26.4  26.0 - 34.0 (pg)    MCHC 32.0  30.0 - 36.0 (g/dL)    RDW 62.1  30.8 - 65.7 (%)    Platelets 326  150 - 400 (K/uL)   DIFFERENTIAL     Status: Normal   Collection Time   10/08/11  9:28 AM      Component Value Range Comment   Neutrophils Relative 64  43 - 77 (%)    Neutro Abs 3.0  1.7 - 7.7 (K/uL)    Lymphocytes Relative 26  12 - 46 (%)    Lymphs Abs 1.2  0.7 - 4.0 (K/uL)    Monocytes Relative 9  3 - 12 (%)    Monocytes Absolute 0.4  0.1 - 1.0 (K/uL)    Eosinophils Relative 1  0 - 5 (%)    Eosinophils Absolute 0.1  0.0 - 0.7 (K/uL)    Basophils Relative 0  0 - 1 (%)    Basophils Absolute 0.0  0.0 - 0.1 (K/uL)   COMPREHENSIVE METABOLIC PANEL     Status: Abnormal   Collection Time   10/08/11  9:28 AM      Component Value Range Comment   Sodium 137  135 - 145 (mEq/L)    Potassium 3.8  3.5 - 5.1 (mEq/L)    Chloride 105  96 - 112 (mEq/L)    CO2 24  19 - 32 (mEq/L)    Glucose, Bld 96  70 - 99 (mg/dL)    BUN 11  6 - 23 (mg/dL)    Creatinine, Ser 8.46  0.50 - 1.10 (mg/dL)    Calcium 9.2  8.4 - 10.5 (mg/dL)    Total Protein 7.0  6.0 - 8.3 (g/dL)    Albumin 3.7  3.5 - 5.2 (g/dL)    AST 14  0 -  37 (U/L)    ALT 10  0 - 35 (U/L)    Alkaline Phosphatase 73  39 - 117 (U/L)    Total Bilirubin 0.2 (*) 0.3 - 1.2 (mg/dL)    GFR calc non Af Amer >90  >90 (mL/min)    GFR calc Af Amer >90  >90 (mL/min)   LIPASE, BLOOD     Status: Normal   Collection Time   10/08/11  9:28 AM      Component Value Range Comment   Lipase 33   11 - 59 (U/L)   URINALYSIS, ROUTINE W REFLEX MICROSCOPIC     Status: Abnormal   Collection Time   10/08/11 12:23 PM      Component Value Range Comment   Color, Urine YELLOW  YELLOW     Appearance CLOUDY (*) CLEAR     Specific Gravity, Urine 1.027  1.005 - 1.030     pH 7.0  5.0 - 8.0     Glucose, UA NEGATIVE  NEGATIVE (mg/dL)    Hgb urine dipstick TRACE (*) NEGATIVE     Bilirubin Urine NEGATIVE  NEGATIVE     Ketones, ur NEGATIVE  NEGATIVE (mg/dL)    Protein, ur NEGATIVE  NEGATIVE (mg/dL)    Urobilinogen, UA 1.0  0.0 - 1.0 (mg/dL)    Nitrite NEGATIVE  NEGATIVE     Leukocytes, UA NEGATIVE  NEGATIVE    URINE MICROSCOPIC-ADD ON     Status: Normal   Collection Time   10/08/11 12:23 PM      Component Value Range Comment   WBC, UA 0-2  <3 (WBC/hpf)    Urine-Other MUCOUS PRESENT       US Abdomen Complete  10/08/2011  *RADIOLOGY REPORT*  Clinical Data:  Right upper quadrant pain  COMPLETE ABDOMINAL ULTRASOUND  Comparison:  None.  Findings:  Gallbladder:  There there is sludge within the gallbladder. There are shadowing gallstones measuring up to 10 mm.  No pericholecystic fluid.  Gallbladder wall thickening. Negative sonographic Murphy's sign  Common bile duct:  Normal at 3 mm  Liver:  No focal lesion identified.  Within normal limits in parenchymal echogenicity.  IVC:  Appears normal.  Pancreas:  No focal abnormality seen.  Spleen:  Normal in size and echogenicity.  Right Kidney:  11.0cm in length.  No evidence of hydronephrosis or stones.  Left Kidney:  10.8cm in length.  No evidence of hydronephrosis or stones.  Abdominal aorta:  No aneurysm identified.  IMPRESSION:  1.  Cholelithiasis without secondary signs of cholecystitis. 2.  Normal common bile duct.  Original Report Authenticated By: Genevive Bi, M.D.    Assessment/Plan: Symptomatic cholelithiasis that has improved while she is in the emergency department. No evidence of acute cholecystitis.2  Plan: She can be discharged from the  emergency department. She'll need to be on a liquid diet with toast and crackers for 48 hours. We'll give her medication for pain and nausea. At the severe symptoms recur she was started to come back to the emergency department. We will plan on scheduling an elective outpatient laparoscopic cholecystectomy.  I have explained the procedure, risks, and aftercare of cholecystectomy.  Risks include but are not limited to bleeding, infection, wound problems, anesthesia, diarrhea, bile leak, injury to common bile duct/liver/intestine.  She seems to understand and agrees to proceed.   Clayten Allcock J 10/08/2011, 2:06 PM

## 2011-10-08 NOTE — ED Provider Notes (Signed)
History     CSN: [id number] Arrival date & time: 10/08/2011  8:07 AM   First MD Initiated Contact with Patient 10/08/11 0831      Chief Complaint  Patient presents with  . Abdominal Pain    (Consider location/radiation/quality/duration/timing/severity/associated sxs/prior treatment) Patient is a 25 y.o. female presenting with abdominal pain. The history is provided by the patient.  Abdominal Pain The primary symptoms of the illness include abdominal pain and nausea. The primary symptoms of the illness do not include fever, shortness of breath, vomiting or diarrhea. The current episode started 6 to 12 hours ago.  The patient states that she believes she is currently not pregnant. Additional symptoms associated with the illness include anorexia. Symptoms associated with the illness do not include chills, constipation or back pain.   patient states she woke up about midnight with pain in her upper abdomen. She had nausea and without vomiting. No fevers. No diarrhea or constipation. She has not eaten since it started. She states that she is not hungry. She's had an episode of this before that she states she thought was gas. She states she is not pregnant because she has an implant. She's not been around anyone sick. She she does not drink alcohol.  History reviewed. No pertinent past medical history.  History reviewed. No pertinent past surgical history.  No family history on file.  History  Substance Use Topics  . Smoking status: Never Smoker   . Smokeless tobacco: Not on file  . Alcohol Use: No    OB History    Grav Para Term Preterm Abortions TAB SAB Ect Mult Living                  Review of Systems  Constitutional: Positive for appetite change. Negative for fever, chills and activity change.  HENT: Negative for neck stiffness.   Eyes: Negative for pain.  Respiratory: Negative for chest tightness and shortness of breath.   Cardiovascular: Negative for chest pain and leg  swelling.  Gastrointestinal: Positive for nausea, abdominal pain and anorexia. Negative for vomiting, diarrhea and constipation.  Genitourinary: Negative for flank pain.  Musculoskeletal: Negative for back pain.  Skin: Negative for rash.  Neurological: Negative for weakness, numbness and headaches.  Psychiatric/Behavioral: Negative for behavioral problems.    Allergies  Review of patient's allergies indicates no known allergies.  Home Medications  No current outpatient prescriptions on file.  BP 137/81  Pulse 62  Temp(Src) 98.3 F (36.8 C) (Oral)  Resp 20  SpO2 100%  LMP 10/08/2011  Physical Exam  Nursing note and vitals reviewed. Constitutional: She is oriented to person, place, and time. She appears well-developed and well-nourished.       Patient is obese  HENT:  Head: Normocephalic and atraumatic.  Eyes: EOM are normal. Pupils are equal, round, and reactive to light.  Neck: Normal range of motion. Neck supple.  Cardiovascular: Normal rate, regular rhythm and normal heart sounds.   No murmur heard. Pulmonary/Chest: Effort normal and breath sounds normal. No respiratory distress. She has no wheezes. She has no rales.  Abdominal: Soft. Bowel sounds are normal. She exhibits no distension. There is tenderness. There is no rebound and no guarding.       Patient is tender in the epigastric to upper abdominal areas. No rebound or guarding. She is obese.  Musculoskeletal: Normal range of motion.  Neurological: She is alert and oriented to person, place, and time. No cranial nerve deficit.  Skin: Skin is warm  and dry.  Psychiatric: She has a normal mood and affect. Her speech is normal.    ED Course  Procedures (including critical care time)  Labs Reviewed  CBC - Abnormal; Notable for the following:    Hemoglobin 11.4 (*)    HCT 35.6 (*)    All other components within normal limits  COMPREHENSIVE METABOLIC PANEL - Abnormal; Notable for the following:    Total Bilirubin  0.2 (*)    All other components within normal limits  URINALYSIS, ROUTINE W REFLEX MICROSCOPIC - Abnormal; Notable for the following:    Appearance CLOUDY (*)    Hgb urine dipstick LARGE (*)    Protein, ur 30 (*)    Leukocytes, UA SMALL (*)    All other components within normal limits  URINE MICROSCOPIC-ADD ON - Abnormal; Notable for the following:    Squamous Epithelial / LPF MANY (*)    All other components within normal limits  DIFFERENTIAL  PREGNANCY, URINE  LIPASE, BLOOD  URINALYSIS, ROUTINE W REFLEX MICROSCOPIC   US Abdomen Complete  10/08/2011  *RADIOLOGY REPORT*  Clinical Data:  Right upper quadrant pain  COMPLETE ABDOMINAL ULTRASOUND  Comparison:  None.  Findings:  Gallbladder:  There there is sludge within the gallbladder. There are shadowing gallstones measuring up to 10 mm.  No pericholecystic fluid.  Gallbladder wall thickening. Negative sonographic Murphy's sign  Common bile duct:  Normal at 3 mm  Liver:  No focal lesion identified.  Within normal limits in parenchymal echogenicity.  IVC:  Appears normal.  Pancreas:  No focal abnormality seen.  Spleen:  Normal in size and echogenicity.  Right Kidney:  11.0cm in length.  No evidence of hydronephrosis or stones.  Left Kidney:  10.8cm in length.  No evidence of hydronephrosis or stones.  Abdominal aorta:  No aneurysm identified.  IMPRESSION:  1.  Cholelithiasis without secondary signs of cholecystitis. 2.  Normal common bile duct.  Original Report Authenticated By: Genevive Bi, M.D.     1. Cholecystitis       MDM  Patient has epigastric abdominal pain. She's obese female. She is tender. Ultrasound shows gallstones. Without cholecystic fluid or wall thickening. With continued tenderness she be seen by Dr. Renee Harder. from surgery.        Juliet Rude. Rubin Payor, MD 10/08/11 1314

## 2011-10-10 ENCOUNTER — Telehealth (INDEPENDENT_AMBULATORY_CARE_PROVIDER_SITE_OTHER): Payer: Self-pay

## 2011-10-10 NOTE — Telephone Encounter (Signed)
They received a script for Vicodin from Dr Abbey Chatters this weekend without a strength on it.  I authorized 5/325.

## 2011-10-12 ENCOUNTER — Other Ambulatory Visit (INDEPENDENT_AMBULATORY_CARE_PROVIDER_SITE_OTHER): Payer: Self-pay | Admitting: General Surgery

## 2011-10-19 ENCOUNTER — Telehealth (INDEPENDENT_AMBULATORY_CARE_PROVIDER_SITE_OTHER): Payer: Self-pay

## 2011-10-28 NOTE — Telephone Encounter (Signed)
Close encounter 

## 2011-11-09 ENCOUNTER — Ambulatory Visit (INDEPENDENT_AMBULATORY_CARE_PROVIDER_SITE_OTHER): Payer: Medicaid Other | Admitting: General Surgery

## 2011-11-09 ENCOUNTER — Encounter (INDEPENDENT_AMBULATORY_CARE_PROVIDER_SITE_OTHER): Payer: Self-pay | Admitting: General Surgery

## 2011-11-09 VITALS — BP 128/88 | HR 68 | Temp 97.3°F | Resp 18 | Ht 63.0 in | Wt 285.4 lb

## 2011-11-09 DIAGNOSIS — K8020 Calculus of gallbladder without cholecystitis without obstruction: Secondary | ICD-10-CM

## 2011-11-09 NOTE — Patient Instructions (Signed)
Continue low fat diet

## 2011-11-09 NOTE — Progress Notes (Signed)
Patient ID: [name], female   DOB: 14-Dec-1985, 25 y.o.   MRN: [id number]  Chief Complaint  Patient presents with  . New Evaluation    eval of GB with stones     HPI [name] is a 25 y.o. female.   HPI  She presented to the office for followup and to schedule surgery for her symptomatic cholelithiasis. I had seen her in the emergency department in November and she has been pain free since that time. She is on a low-fat diet.  Past Medical History  Diagnosis Date  . Pneumonia     bilateral, postpartum    History reviewed. No pertinent past surgical history.  Family History  Problem Relation Age of Onset  . Hypertension Mother     Social History History  Substance Use Topics  . Smoking status: Never Smoker   . Smokeless tobacco: Never Used  . Alcohol Use: No    No Known Allergies  No current outpatient prescriptions on file.    Review of Systems Review of Systems  Constitutional: Negative for fever and chills.  Gastrointestinal: Negative.     Blood pressure 128/88, pulse 68, temperature 97.3 F (36.3 C), temperature source Temporal, resp. rate 18, height 5\' 3"  (1.6 m), weight 285 lb 6 oz (129.445 kg).  Physical Exam Physical Exam  Constitutional: No distress.       Morbidly obese  HENT:  Head: Normocephalic and atraumatic.  Eyes: Conjunctivae and EOM are normal.  Abdominal: Soft. She exhibits no mass. There is no tenderness. There is no guarding.    Data Reviewed Emergency room consult note  Assessment    Symptomatic cholelithiasis    Plan   Laparoscopic cholecystectomy.  I have explained the procedure, risks, and aftercare of cholecystectomy.  Risks include but are not limited to bleeding, infection, wound problems, anesthesia, diarrhea, bile leak, injury to common bile duct/liver/intestine.  She seems to understand and agrees to proceed.         Ethen Bannan J 11/09/2011, 11:10 AM

## 2011-12-01 ENCOUNTER — Encounter (HOSPITAL_COMMUNITY): Payer: Self-pay | Admitting: Pharmacy Technician

## 2011-12-02 NOTE — Pre-Procedure Instructions (Signed)
[address] [place]  12/02/2011   Your procedure is scheduled on:  Tues,Jan 8 @ 0730  Report to [facility] at 0530 AM.  Call this number if you have problems the morning of surgery: [phone]   Remember:   Do not eat food:After Midnight.  May have clear liquids: up to 4 Hours before arrival.(until 1:30 am)  Clear liquids include soda, tea, black coffee, apple or grape juice, broth.  Take these medicines the morning of surgery with A SIP OF WATER: Zofran and Pain Pill(if needed)   Do not wear jewelry, make-up or nail polish.  Do not wear lotions, powders, or perfumes. You may wear deodorant.  Do not shave 48 hours prior to surgery.  Do not bring valuables to the hospital.  Contacts, dentures or bridgework may not be worn into surgery.  Leave suitcase in the car. After surgery it may be brought to your room.  For patients admitted to the hospital, checkout time is 11:00 AM the day of discharge.   Patients discharged the day of surgery will not be allowed to drive home.  Name and phone number of your driver:   Special Instructions: CHG Shower Use Special Wash: 1/2 bottle night before surgery and 1/2 bottle morning of surgery.   Please read over the following fact sheets that you were given: Pain Booklet, Coughing and Deep Breathing, MRSA Information and Surgical Site Infection Prevention

## 2011-12-05 ENCOUNTER — Encounter (HOSPITAL_COMMUNITY)
Admission: RE | Admit: 2011-12-05 | Discharge: 2011-12-05 | Disposition: A | Discharge status: Home / Self Care | Payer: Medicaid Other | Source: Ambulatory Visit | Attending: Anesthesiology | Admitting: Anesthesiology

## 2011-12-05 ENCOUNTER — Encounter (HOSPITAL_COMMUNITY)
Admission: RE | Admit: 2011-12-05 | Discharge: 2011-12-05 | Disposition: A | Discharge status: Home / Self Care | Payer: Medicaid Other | Source: Ambulatory Visit | Attending: General Surgery | Admitting: General Surgery

## 2011-12-05 ENCOUNTER — Encounter (HOSPITAL_COMMUNITY): Payer: Self-pay

## 2011-12-05 HISTORY — DX: Urinary tract infection, site not specified: N39.0

## 2011-12-05 HISTORY — DX: Anemia, unspecified: D64.9

## 2011-12-05 LAB — CBC
HCT: 36.7 % (ref 36.0–46.0)
Hemoglobin: 11.4 g/dL — ABNORMAL LOW (ref 12.0–15.0)
MCH: 26 pg (ref 26.0–34.0)
MCV: 83.6 fL (ref 78.0–100.0)
RBC: 4.39 MIL/uL (ref 3.87–5.11)

## 2011-12-05 MED ORDER — CEFAZOLIN SODIUM-DEXTROSE 2-3 GM-% IV SOLR
2.0000 g | INTRAVENOUS | Status: AC
  Administered 2011-12-06: 2 g via INTRAVENOUS
  Filled 2011-12-05: qty 50

## 2011-12-05 MED ORDER — CEFAZOLIN SODIUM 1-5 GM-% IV SOLN
1.0000 g | INTRAVENOUS | Status: DC
  Filled 2011-12-05: qty 50

## 2011-12-06 ENCOUNTER — Encounter (HOSPITAL_COMMUNITY)
Admission: RE | Disposition: A | Discharge status: Home / Self Care | Payer: Self-pay | Source: Ambulatory Visit | Attending: General Surgery

## 2011-12-06 ENCOUNTER — Ambulatory Visit (HOSPITAL_COMMUNITY): Payer: Medicaid Other

## 2011-12-06 ENCOUNTER — Ambulatory Visit (HOSPITAL_COMMUNITY)
Admission: RE | Admit: 2011-12-06 | Discharge: 2011-12-07 | Disposition: A | Discharge status: Home / Self Care | Payer: Medicaid Other | Source: Ambulatory Visit | Attending: General Surgery | Admitting: General Surgery

## 2011-12-06 ENCOUNTER — Encounter (HOSPITAL_COMMUNITY): Payer: Self-pay | Admitting: *Deleted

## 2011-12-06 ENCOUNTER — Encounter (HOSPITAL_COMMUNITY): Payer: Self-pay | Admitting: General Practice

## 2011-12-06 ENCOUNTER — Ambulatory Visit (HOSPITAL_COMMUNITY): Payer: Medicaid Other | Admitting: *Deleted

## 2011-12-06 ENCOUNTER — Other Ambulatory Visit (INDEPENDENT_AMBULATORY_CARE_PROVIDER_SITE_OTHER): Payer: Self-pay | Admitting: General Surgery

## 2011-12-06 DIAGNOSIS — K8010 Calculus of gallbladder with chronic cholecystitis without obstruction: Secondary | ICD-10-CM

## 2011-12-06 DIAGNOSIS — K8020 Calculus of gallbladder without cholecystitis without obstruction: Secondary | ICD-10-CM

## 2011-12-06 DIAGNOSIS — Z23 Encounter for immunization: Secondary | ICD-10-CM | POA: Insufficient documentation

## 2011-12-06 DIAGNOSIS — Z01812 Encounter for preprocedural laboratory examination: Secondary | ICD-10-CM | POA: Insufficient documentation

## 2011-12-06 DIAGNOSIS — Z01818 Encounter for other preprocedural examination: Secondary | ICD-10-CM | POA: Insufficient documentation

## 2011-12-06 HISTORY — PX: CHOLECYSTECTOMY: SHX55

## 2011-12-06 SURGERY — LAPAROSCOPIC CHOLECYSTECTOMY WITH INTRAOPERATIVE CHOLANGIOGRAM
Anesthesia: General | Site: Abdomen | Wound class: Clean Contaminated

## 2011-12-06 MED ORDER — MIDAZOLAM HCL 5 MG/5ML IJ SOLN
INTRAMUSCULAR | Status: DC | PRN
  Administered 2011-12-06: 2 mg via INTRAVENOUS

## 2011-12-06 MED ORDER — KETOROLAC TROMETHAMINE 30 MG/ML IJ SOLN
INTRAMUSCULAR | Status: DC | PRN
  Administered 2011-12-06: 30 mg via INTRAVENOUS

## 2011-12-06 MED ORDER — FENTANYL CITRATE 0.05 MG/ML IJ SOLN
INTRAMUSCULAR | Status: DC | PRN
  Administered 2011-12-06: 50 ug via INTRAVENOUS
  Administered 2011-12-06 (×2): 100 ug via INTRAVENOUS

## 2011-12-06 MED ORDER — ROCURONIUM BROMIDE 100 MG/10ML IV SOLN
INTRAVENOUS | Status: DC | PRN
  Administered 2011-12-06: 50 mg via INTRAVENOUS

## 2011-12-06 MED ORDER — ONDANSETRON HCL 4 MG PO TABS: 4.0000 mg | ORAL_TABLET | Freq: Four times a day (QID) | ORAL | Status: DC | PRN

## 2011-12-06 MED ORDER — HYDROCODONE-ACETAMINOPHEN 5-325 MG PO TABS
1.0000 | ORAL_TABLET | ORAL | Status: DC | PRN
  Administered 2011-12-06: 2 via ORAL
  Administered 2011-12-07: 1 via ORAL
  Filled 2011-12-06 (×2): qty 2

## 2011-12-06 MED ORDER — HYDROCODONE-ACETAMINOPHEN 5-325 MG PO TABS: 1.0000 | ORAL_TABLET | ORAL | Status: AC | PRN

## 2011-12-06 MED ORDER — KCL IN DEXTROSE-NACL 20-5-0.9 MEQ/L-%-% IV SOLN
INTRAVENOUS | Status: DC
  Administered 2011-12-06 (×2): via INTRAVENOUS
  Filled 2011-12-06 (×4): qty 1000

## 2011-12-06 MED ORDER — NEOSTIGMINE METHYLSULFATE 1 MG/ML IJ SOLN
INTRAMUSCULAR | Status: DC | PRN
  Administered 2011-12-06: 5 mg via INTRAVENOUS

## 2011-12-06 MED ORDER — IOHEXOL 300 MG/ML  SOLN
INTRAMUSCULAR | Status: DC | PRN
  Administered 2011-12-06: 4 mL

## 2011-12-06 MED ORDER — MORPHINE SULFATE 2 MG/ML IJ SOLN
2.0000 mg | INTRAMUSCULAR | Status: DC | PRN
  Administered 2011-12-06 – 2011-12-07 (×3): 2 mg via INTRAVENOUS
  Filled 2011-12-06: qty 1
  Filled 2011-12-06 (×2): qty 2

## 2011-12-06 MED ORDER — LACTATED RINGERS IV SOLN
INTRAVENOUS | Status: DC | PRN
  Administered 2011-12-06: 07:00:00 via INTRAVENOUS

## 2011-12-06 MED ORDER — LIDOCAINE HCL (CARDIAC) 20 MG/ML IV SOLN
INTRAVENOUS | Status: DC | PRN
  Administered 2011-12-06: 80 mg via INTRAVENOUS

## 2011-12-06 MED ORDER — INFLUENZA VIRUS VACC SPLIT PF IM SUSP
0.5000 mL | INTRAMUSCULAR | Status: AC
  Administered 2011-12-07: 0.5 mL via INTRAMUSCULAR
  Filled 2011-12-06: qty 0.5

## 2011-12-06 MED ORDER — HYDROMORPHONE HCL PF 1 MG/ML IJ SOLN
0.2500 mg | INTRAMUSCULAR | Status: DC | PRN
Start: 2011-12-06 — End: 2011-12-06
  Administered 2011-12-06 (×2): 0.5 mg via INTRAVENOUS

## 2011-12-06 MED ORDER — ONDANSETRON HCL 4 MG/2ML IJ SOLN
INTRAMUSCULAR | Status: DC | PRN
  Administered 2011-12-06: 4 mg via INTRAVENOUS

## 2011-12-06 MED ORDER — 0.9 % SODIUM CHLORIDE (POUR BTL) OPTIME
TOPICAL | Status: DC | PRN
  Administered 2011-12-06: 1000 mL

## 2011-12-06 MED ORDER — DROPERIDOL 2.5 MG/ML IJ SOLN: 0.6250 mg | INTRAMUSCULAR | Status: DC | PRN

## 2011-12-06 MED ORDER — GLYCOPYRROLATE 0.2 MG/ML IJ SOLN
INTRAMUSCULAR | Status: DC | PRN
  Administered 2011-12-06: .8 mg via INTRAVENOUS

## 2011-12-06 MED ORDER — PROPOFOL 10 MG/ML IV EMUL
INTRAVENOUS | Status: DC | PRN
  Administered 2011-12-06: 200 mg via INTRAVENOUS

## 2011-12-06 MED ORDER — BUPIVACAINE-EPINEPHRINE 0.25% -1:200000 IJ SOLN
INTRAMUSCULAR | Status: DC | PRN
  Administered 2011-12-06: 16 mL

## 2011-12-06 MED ORDER — ONDANSETRON HCL 4 MG/2ML IJ SOLN: 4.0000 mg | Freq: Four times a day (QID) | INTRAMUSCULAR | Status: DC | PRN

## 2011-12-06 MED ORDER — SODIUM CHLORIDE 0.9 % IR SOLN
Status: DC | PRN
  Administered 2011-12-06: 1000 mL

## 2011-12-06 SURGICAL SUPPLY — 44 items
APPLIER CLIP 5 13 M/L LIGAMAX5 (MISCELLANEOUS) ×2
BENZOIN TINCTURE PRP APPL 2/3 (GAUZE/BANDAGES/DRESSINGS) ×2 IMPLANT
CANISTER SUCTION 2500CC (MISCELLANEOUS) ×2 IMPLANT
CHLORAPREP W/TINT 26ML (MISCELLANEOUS) ×2 IMPLANT
CLIP APPLIE 5 13 M/L LIGAMAX5 (MISCELLANEOUS) ×1 IMPLANT
CLOTH BEACON ORANGE TIMEOUT ST (SAFETY) ×2 IMPLANT
COVER MAYO STAND STRL (DRAPES) ×2 IMPLANT
COVER SURGICAL LIGHT HANDLE (MISCELLANEOUS) ×4 IMPLANT
DECANTER SPIKE VIAL GLASS SM (MISCELLANEOUS) ×4 IMPLANT
DRAPE C-ARM 42X72 X-RAY (DRAPES) ×2 IMPLANT
DRAPE UTILITY 15X26 W/TAPE STR (DRAPE) ×4 IMPLANT
ELECT REM PT RETURN 9FT ADLT (ELECTROSURGICAL) ×2
ELECTRODE REM PT RTRN 9FT ADLT (ELECTROSURGICAL) ×1 IMPLANT
GAUZE SPONGE 2X2 8PLY STRL LF (GAUZE/BANDAGES/DRESSINGS) ×1 IMPLANT
GLOVE BIO SURGEON STRL SZ7 (GLOVE) ×2 IMPLANT
GLOVE BIOGEL PI IND STRL 7.0 (GLOVE) ×2 IMPLANT
GLOVE BIOGEL PI IND STRL 8 (GLOVE) ×2 IMPLANT
GLOVE BIOGEL PI INDICATOR 7.0 (GLOVE) ×2
GLOVE BIOGEL PI INDICATOR 8 (GLOVE) ×2
GLOVE ECLIPSE 8.0 STRL XLNG CF (GLOVE) ×2 IMPLANT
GLOVE EUDERMIC 7 POWDERFREE (GLOVE) ×2 IMPLANT
GLOVE SURG SS PI 6.5 STRL IVOR (GLOVE) ×2 IMPLANT
GOWN PREVENTION PLUS XLARGE (GOWN DISPOSABLE) ×2 IMPLANT
GOWN STRL NON-REIN LRG LVL3 (GOWN DISPOSABLE) ×6 IMPLANT
KIT BASIN OR (CUSTOM PROCEDURE TRAY) ×2 IMPLANT
KIT ROOM TURNOVER OR (KITS) ×2 IMPLANT
NS IRRIG 1000ML POUR BTL (IV SOLUTION) ×2 IMPLANT
PAD ARMBOARD 7.5X6 YLW CONV (MISCELLANEOUS) ×2 IMPLANT
POUCH SPECIMEN RETRIEVAL 10MM (ENDOMECHANICALS) ×2 IMPLANT
SCISSORS LAP 5X35 DISP (ENDOMECHANICALS) IMPLANT
SET CHOLANGIOGRAPH 5 50 .035 (SET/KITS/TRAYS/PACK) ×2 IMPLANT
SET IRRIG TUBING LAPAROSCOPIC (IRRIGATION / IRRIGATOR) ×2 IMPLANT
SLEEVE ENDOPATH XCEL 5M (ENDOMECHANICALS) ×4 IMPLANT
SPECIMEN JAR MEDIUM (MISCELLANEOUS) ×2 IMPLANT
SPECIMEN JAR SMALL (MISCELLANEOUS) IMPLANT
SPONGE GAUZE 2X2 STER 10/PKG (GAUZE/BANDAGES/DRESSINGS) ×1
SUT MON AB 4-0 PC3 18 (SUTURE) ×2 IMPLANT
TOWEL OR 17X24 6PK STRL BLUE (TOWEL DISPOSABLE) ×2 IMPLANT
TOWEL OR 17X26 10 PK STRL BLUE (TOWEL DISPOSABLE) ×2 IMPLANT
TRAY LAPAROSCOPIC (CUSTOM PROCEDURE TRAY) ×2 IMPLANT
TROCAR XCEL BLUNT TIP 100MML (ENDOMECHANICALS) ×2 IMPLANT
TROCAR XCEL NON-BLD 11X100MML (ENDOMECHANICALS) IMPLANT
TROCAR XCEL NON-BLD 5MMX100MML (ENDOMECHANICALS) ×2 IMPLANT
WATER STERILE IRR 1000ML POUR (IV SOLUTION) IMPLANT

## 2011-12-06 NOTE — Transfer of Care (Signed)
Immediate Anesthesia Transfer of Care Note  Patient: [name]  Procedure(s) Performed:  LAPAROSCOPIC CHOLECYSTECTOMY WITH INTRAOPERATIVE CHOLANGIOGRAM - Laparoscopic cholecystectomy with intraoperative cholangiogram.  Patient Location: PACU  Anesthesia Type: General  Level of Consciousness: sedated  Airway & Oxygen Therapy: Patient Spontanous Breathing  Post-op Assessment: Report given to PACU RN and Post -op Vital signs reviewed and stable  Post vital signs: Reviewed and stable  Complications: No apparent anesthesia complications

## 2011-12-06 NOTE — Op Note (Signed)
Preoperative diagnosis:  Symptomatic cholelithiasis  Postoperative diagnosis:  Same  Procedure: Laparoscopic cholecystectomy with cholangiogram.  Surgeon: Avel Peace, M.D.  Asst.:  Claud Kelp M.D.  Anesthesia: Gen.  Indication:   [name] Is a 26 year old female who had an attack of acute biliary colic approximately 6 months postpartum. She had been having small episodes during her pregnancy but thought they were just related to her pregnancy state.  She presented to the emergency department and was found to have cholelithiasis.  I saw her there and her symptoms improved after medication. She was subsequent scheduled for an elective laparoscopic cholecystectomy and she presents for that.  Technique: The patient was brought to the operating room, placed supine on the operating table, and a general anesthetic was administered. The hair on the abdominal wall was clipped as was necessary. The abdominal wall was then sterilely prepped and draped. Local anesthetic (Marcaine) was infiltrated in the subumbilical region. A small subumbilical incision was made through the skin, subcutaneous tissue, fascia, and peritoneum entering the peritoneal cavity under direct vision. A pursestring suture of 0 Vicryl was placed around the edges of the fascia. A Hassan trocar was introduced into the peritoneal cavity and a pneumoperitoneum was created by insufflation of carbon dioxide gas. The laparoscope was introduced into the trocar and no underlying bleeding or organ injury was noted. The patient was then placed in the reverse Trendelenburg position with the right side tilted slightly up.  Three more trochars were then placed into the abdominal cavity under laparoscopic vision. One in the epigastric area, and 2 in the right upper quadrant area. The gallbladder was visualized and chronic inflammatory changes were noted. The fundus was grasped and retracted toward the right shoulder.  The infundibulum was  mobilized with dissection close to the gallbladder and retracted laterally. The cystic duct was identified and a window was created around it. The cystic artery was also identified and a window was created around it. The critical view was achieved. A clip was placed at the neck of the gallbladder. A small incision was made in the cystic duct. A cholangiocatheter was introduced through the anterior abdominal wall and placed in the cystic duct. A intraoperative cholangiogram was then performed.  Under real-time fluoroscopy, dilute contrast was injected into the cystic duct.  The common hepatic duct, the right and left hepatic ducts, and the common duct were all visualized. Contrast drained into the duodenum without obvious evidence of any obstructing ductal lesion. The final report is pending the Radiologist's interpretation.  The cholangiocatheter was removed, the cystic duct was clipped 3 times on the biliary side, and then the cystic duct was divided sharply. No bile leak was noted from the cystic duct stump.  The cystic artery was then clipped and divided. Following this the gallbladder was dissected free from the liver using electrocautery. The gallbladder was then placed in a retrieval bag and removed from the abdominal cavity through the subumbilical incision.  The gallbladder fossa was inspected, irrigated, and bleeding was controlled with electrocautery. Inspection showed that hemostasis was adequate and there was no evidence of bile leak.  The irrigation fluid was evacuated as much as possible.  The subumbilical trocar was removed and the fascial defect was closed by tightening and tying down the pursestring suture under laparoscopic vision.  The remaining trochars were removed and the pneumoperitoneum was released. The skin incisions were closed with 4-0 Monocryl subcuticular stitches. Steri-Strips and sterile dressings were applied.  The procedure was well-tolerated without any apparent  complications. The patient was taken to the recovery room in satisfactory condition.

## 2011-12-06 NOTE — Anesthesia Preprocedure Evaluation (Addendum)
Anesthesia Evaluation  Patient identified by MRN, date of birth, ID band Patient awake    Reviewed: Allergy & Precautions, H&P , NPO status , Patient's Chart, lab work & pertinent test results  Airway Mallampati: II TM Distance: <3 FB     Dental  (+) Teeth Intact and Dental Advisory Given   Pulmonary          Cardiovascular     Neuro/Psych    GI/Hepatic   Endo/Other    Renal/GU      Musculoskeletal   Abdominal   Peds  Hematology   Anesthesia Other Findings   Reproductive/Obstetrics                          Anesthesia Physical Anesthesia Plan  ASA: II  Anesthesia Plan: General   Post-op Pain Management:    Induction: Intravenous  Airway Management Planned: Oral ETT  Additional Equipment:   Intra-op Plan:   Post-operative Plan: Extubation in OR  Informed Consent:   Dental advisory given  Plan Discussed with: Anesthesiologist  Anesthesia Plan Comments:         Anesthesia Quick Evaluation

## 2011-12-06 NOTE — H&P (View-Only) (Signed)
Patient ID: [name], female   DOB: 06/25/1986, 25 y.o.   MRN: [id number]  Chief Complaint  Patient presents with  . New Evaluation    eval of GB with stones     HPI [name] is a 25 y.o. female.   HPI  She presented to the office for followup and to schedule surgery for her symptomatic cholelithiasis. I had seen her in the emergency department in November and she has been pain free since that time. She is on a low-fat diet.  Past Medical History  Diagnosis Date  . Pneumonia     bilateral, postpartum    History reviewed. No pertinent past surgical history.  Family History  Problem Relation Age of Onset  . Hypertension Mother     Social History History  Substance Use Topics  . Smoking status: Never Smoker   . Smokeless tobacco: Never Used  . Alcohol Use: No    No Known Allergies  No current outpatient prescriptions on file.    Review of Systems Review of Systems  Constitutional: Negative for fever and chills.  Gastrointestinal: Negative.     Blood pressure 128/88, pulse 68, temperature 97.3 F (36.3 C), temperature source Temporal, resp. rate 18, height 5' 3" (1.6 m), weight 285 lb 6 oz (129.445 kg).  Physical Exam Physical Exam  Constitutional: No distress.       Morbidly obese  HENT:  Head: Normocephalic and atraumatic.  Eyes: Conjunctivae and EOM are normal.  Abdominal: Soft. She exhibits no mass. There is no tenderness. There is no guarding.    Data Reviewed Emergency room consult note  Assessment    Symptomatic cholelithiasis    Plan   Laparoscopic cholecystectomy.  I have explained the procedure, risks, and aftercare of cholecystectomy.  Risks include but are not limited to bleeding, infection, wound problems, anesthesia, diarrhea, bile leak, injury to common bile duct/liver/intestine.  She seems to understand and agrees to proceed.         Enio Hornback J 11/09/2011, 11:10 AM    

## 2011-12-06 NOTE — Anesthesia Postprocedure Evaluation (Signed)
Anesthesia Post Note  Patient: [name]  Procedure(s) Performed:  LAPAROSCOPIC CHOLECYSTECTOMY WITH INTRAOPERATIVE CHOLANGIOGRAM - Laparoscopic cholecystectomy with intraoperative cholangiogram.  Anesthesia type: general  Patient location: PACU  Post pain: Pain level controlled  Post assessment: Patient's Cardiovascular Status Stable  Last Vitals:  Filed Vitals:   12/06/11 1015  BP:   Pulse: 52  Temp:   Resp: 14    Post vital signs: Reviewed and stable  Level of consciousness: sedated  Complications: No apparent anesthesia complications

## 2011-12-06 NOTE — Preoperative (Addendum)
Beta Blockers   Reason not to administer Beta Blockers:Not Applicable 

## 2011-12-06 NOTE — Progress Notes (Signed)
Report given to Tifanie rn as caregiver 

## 2011-12-06 NOTE — Interval H&P Note (Signed)
History and Physical Interval Note:  12/06/2011 7:19 AM  [name]  has presented today for surgery, with the diagnosis of Symptomic cholelithiasis  The various methods of treatment have been discussed with the patient. After consideration of risks, benefits and other options for treatment, the patient has consented to  Procedure(s): LAPAROSCOPIC CHOLECYSTECTOMY WITH INTRAOPERATIVE CHOLANGIOGRAM as a surgical intervention .  The patients' history has been reviewed, patient examined, no change in status, stable for surgery.  I have reviewed the patients' chart and labs.  Questions were answered to the patient's satisfaction.     [name] Shela Commons

## 2011-12-06 NOTE — Anesthesia Procedure Notes (Addendum)
Procedure Name: Intubation Date/Time: 12/06/2011 7:43 AM Performed by: Malachi Pro Pre-anesthesia Checklist: Patient identified, Emergency Drugs available, Suction available, Patient being monitored and Timeout performed Patient Re-evaluated:Patient Re-evaluated prior to inductionOxygen Delivery Method: Circle System Utilized Preoxygenation: Pre-oxygenation with 100% oxygen Intubation Type: Combination inhalational/ intravenous induction Ventilation: Mask ventilation without difficulty Laryngoscope Size: Miller and 2 Grade View: Grade II Tube size: 8.0 mm Secured at: 22 cm Tube secured with: Tape Dental Injury: Teeth and Oropharynx as per pre-operative assessment    Date/Time: 12/06/2011 7:43 AM Performed by: Malachi Pro Airway Equipment and Method: stylet and patient positioned with wedge pillow Placement Confirmation: ETT inserted through vocal cords under direct vision,  positive ETCO2 and breath sounds checked- equal and bilateral

## 2011-12-07 ENCOUNTER — Encounter (HOSPITAL_COMMUNITY): Payer: Self-pay | Admitting: General Surgery

## 2011-12-07 NOTE — Progress Notes (Signed)
Discharged home accompanied by family member. [name]  12/07/2011

## 2011-12-07 NOTE — Discharge Summary (Signed)
Physician Discharge Summary  Patient ID: VYLA PINT MRN: [id number] DOB/AGE: May 02, 1986 25 y.o.  Admit date: 12/06/2011 Discharge date: 12/07/2011  Admission Diagnoses:  Symptomatic cholelithiasis  Discharge Diagnoses: symptomatic cholelithiasis Active Problems:  * No active hospital problems. *    Discharged Condition: good  Hospital Course: She underwent a laparoscopic cholecystectomy January 8 and tolerated this well. The morning after surgery she was ready for discharge.  Consults: none  Significant Diagnostic Studies: none  Treatments: surgery:  Laparoscopic cholecystectomy with cholangiogram  Discharge Exam: Blood pressure 114/53, pulse 55, temperature 98.2 F (36.8 C), temperature source Oral, resp. rate 20, height 5\' 3"  (1.6 m), weight 277 lb 9 oz (125.9 kg), last menstrual period 12/06/2011, SpO2 100.00%.  Abdomen-soft, dressing is clean and intact and dry. Disposition: Home or Self Care  Instructions were given to her.   Medication List  As of 12/07/2011 12:11 PM   START taking these medications         * HYDROcodone-acetaminophen 5-325 MG per tablet   Commonly known as: NORCO   Take 1-2 tablets by mouth every 4 (four) hours as needed for pain.     * Notice: This list has 1 medication(s) that are the same as other medications prescribed for you. Read the directions carefully, and ask your doctor or other care provider to review them with you.       CONTINUE taking these medications         * HYDROcodone-acetaminophen 5-325 MG per tablet   Commonly known as: NORCO      ondansetron 4 MG tablet   Commonly known as: ZOFRAN     * Notice: This list has 1 medication(s) that are the same as other medications prescribed for you. Read the directions carefully, and ask your doctor or other care provider to review them with you.        Where to get your medications    These are the prescriptions that you need to pick up.   You may get these medications from any  pharmacy.         HYDROcodone-acetaminophen 5-325 MG per tablet             Signed: Blakeleigh Domek J 12/07/2011, 12:11 PM

## 2011-12-07 NOTE — Progress Notes (Signed)
1 Day Post-Op  Subjective: Sore.  Tolerating diet.  Objective: Vital signs in last 24 hours: Temp:  [97.2 F (36.2 C)-98.8 F (37.1 C)] 98.1 F (36.7 C) (01/09 0550) Pulse Rate:  [50-78] 62  (01/09 0550) Resp:  [10-20] 18  (01/09 0550) BP: (115-134)/(47-81) 127/69 mmHg (01/09 0550) SpO2:  [98 %-100 %] 100 % (01/09 0550) Weight:  [277 lb 9 oz (125.9 kg)] 277 lb 9 oz (125.9 kg) (01/08 2132) Last BM Date: 12/04/11  Intake/Output from previous day: 01/08 0701 - 01/09 0700 In: 2705 [P.O.:480; I.V.:2225] Out: 10 [Blood:10] Intake/Output this shift:    PE: Abd-soft, dressings dry  Lab Results:   [facility] 12/05/11 0949  WBC 4.6  HGB 11.4*  HCT 36.7  PLT 345   BMET No results found for this basename: NA:2,K:2,CL:2,CO2:2,GLUCOSE:2,BUN:2,CREATININE:2,CALCIUM:2 in the last 72 hours PT/INR No results found for this basename: LABPROT:2,INR:2 in the last 72 hours Comprehensive Metabolic Panel:    Component Value Date/Time   NA 137 10/08/2011 0928   K 3.8 10/08/2011 0928   CL 105 10/08/2011 0928   CO2 24 10/08/2011 0928   BUN 11 10/08/2011 0928   CREATININE 0.71 10/08/2011 0928   GLUCOSE 96 10/08/2011 0928   CALCIUM 9.2 10/08/2011 0928   AST 14 10/08/2011 0928   ALT 10 10/08/2011 0928   ALKPHOS 73 10/08/2011 0928   BILITOT 0.2* 10/08/2011 0928   PROT 7.0 10/08/2011 0928   ALBUMIN 3.7 10/08/2011 0928     Studies/Results: Dg Chest 2 View  12/05/2011  *RADIOLOGY REPORT*  Clinical Data: Preop  CHEST - 2 VIEW  Comparison: 04/24/2011  Findings: Cardiomediastinal silhouette is stable.  No acute infiltrate or pleural effusion.  No pulmonary edema.  Previous pneumonia has resolved. Bony thorax is stable.  IMPRESSION: No active disease.  Original Report Authenticated By: Natasha Mead, M.D.   Dg Cholangiogram Operative  12/06/2011  *RADIOLOGY REPORT*  Clinical Data:   Laparoscopic cholecystectomy  INTRAOPERATIVE CHOLANGIOGRAM  Technique:  Cholangiographic images from the C-arm  fluoroscopic device were submitted for interpretation post-operatively.  Please see the procedural report for the amount of contrast and the fluoroscopy time utilized.  Comparison:  None.  Findings:  The common bile duct and intrahepatic ducts have a normal caliber and appearance with no evidence of retained stone. Contrast flows into the duodenum.  There is no extravasation. Fluoroscopy time is 17 seconds  IMPRESSION: Normal intraoperative cholangiogram.  Original Report Authenticated By: Brandon Melnick, M.D.    Anti-infectives: Anti-infectives     Start     Dose/Rate Route Frequency Ordered Stop   12/05/11 1500   ceFAZolin (ANCEF) IVPB 2 g/50 mL premix        2 g 100 mL/hr over 30 Minutes Intravenous 60 min pre-op 12/05/11 1447 12/06/11 0731          Assessment Active Problems:  Post lap chole-doing well.     LOS: 1 day   Plan: Discharge.  Instructions given.   Bennet Kujawa J 12/07/2011

## 2012-06-27 IMAGING — CR DG CHEST 2V
2 series · 2 of 2 positions shown · non-contrast
Comparison: None.

CLINICAL DATA: Persistent productive cough; weakness and
lightheadedness.  4 days postpartum.

CHEST - 2 VIEW

[w chest pa]
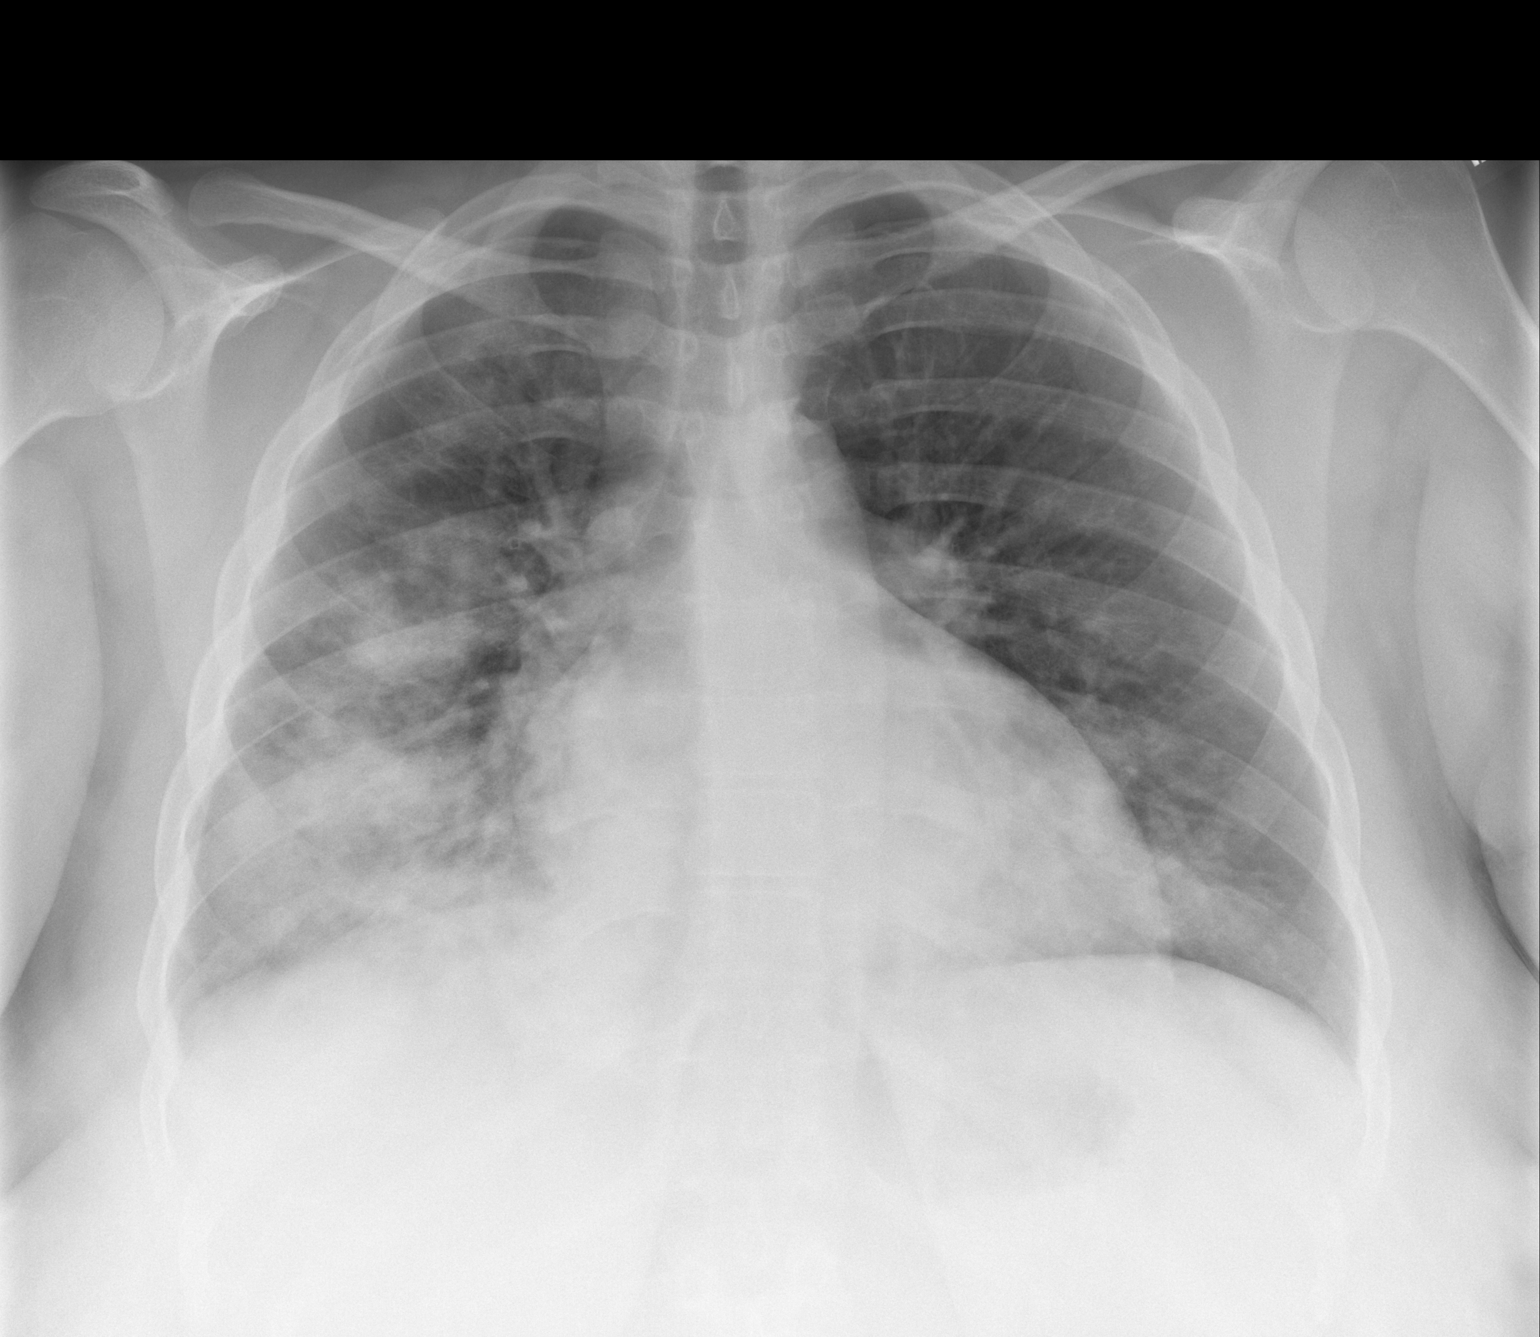

[w chest lat]
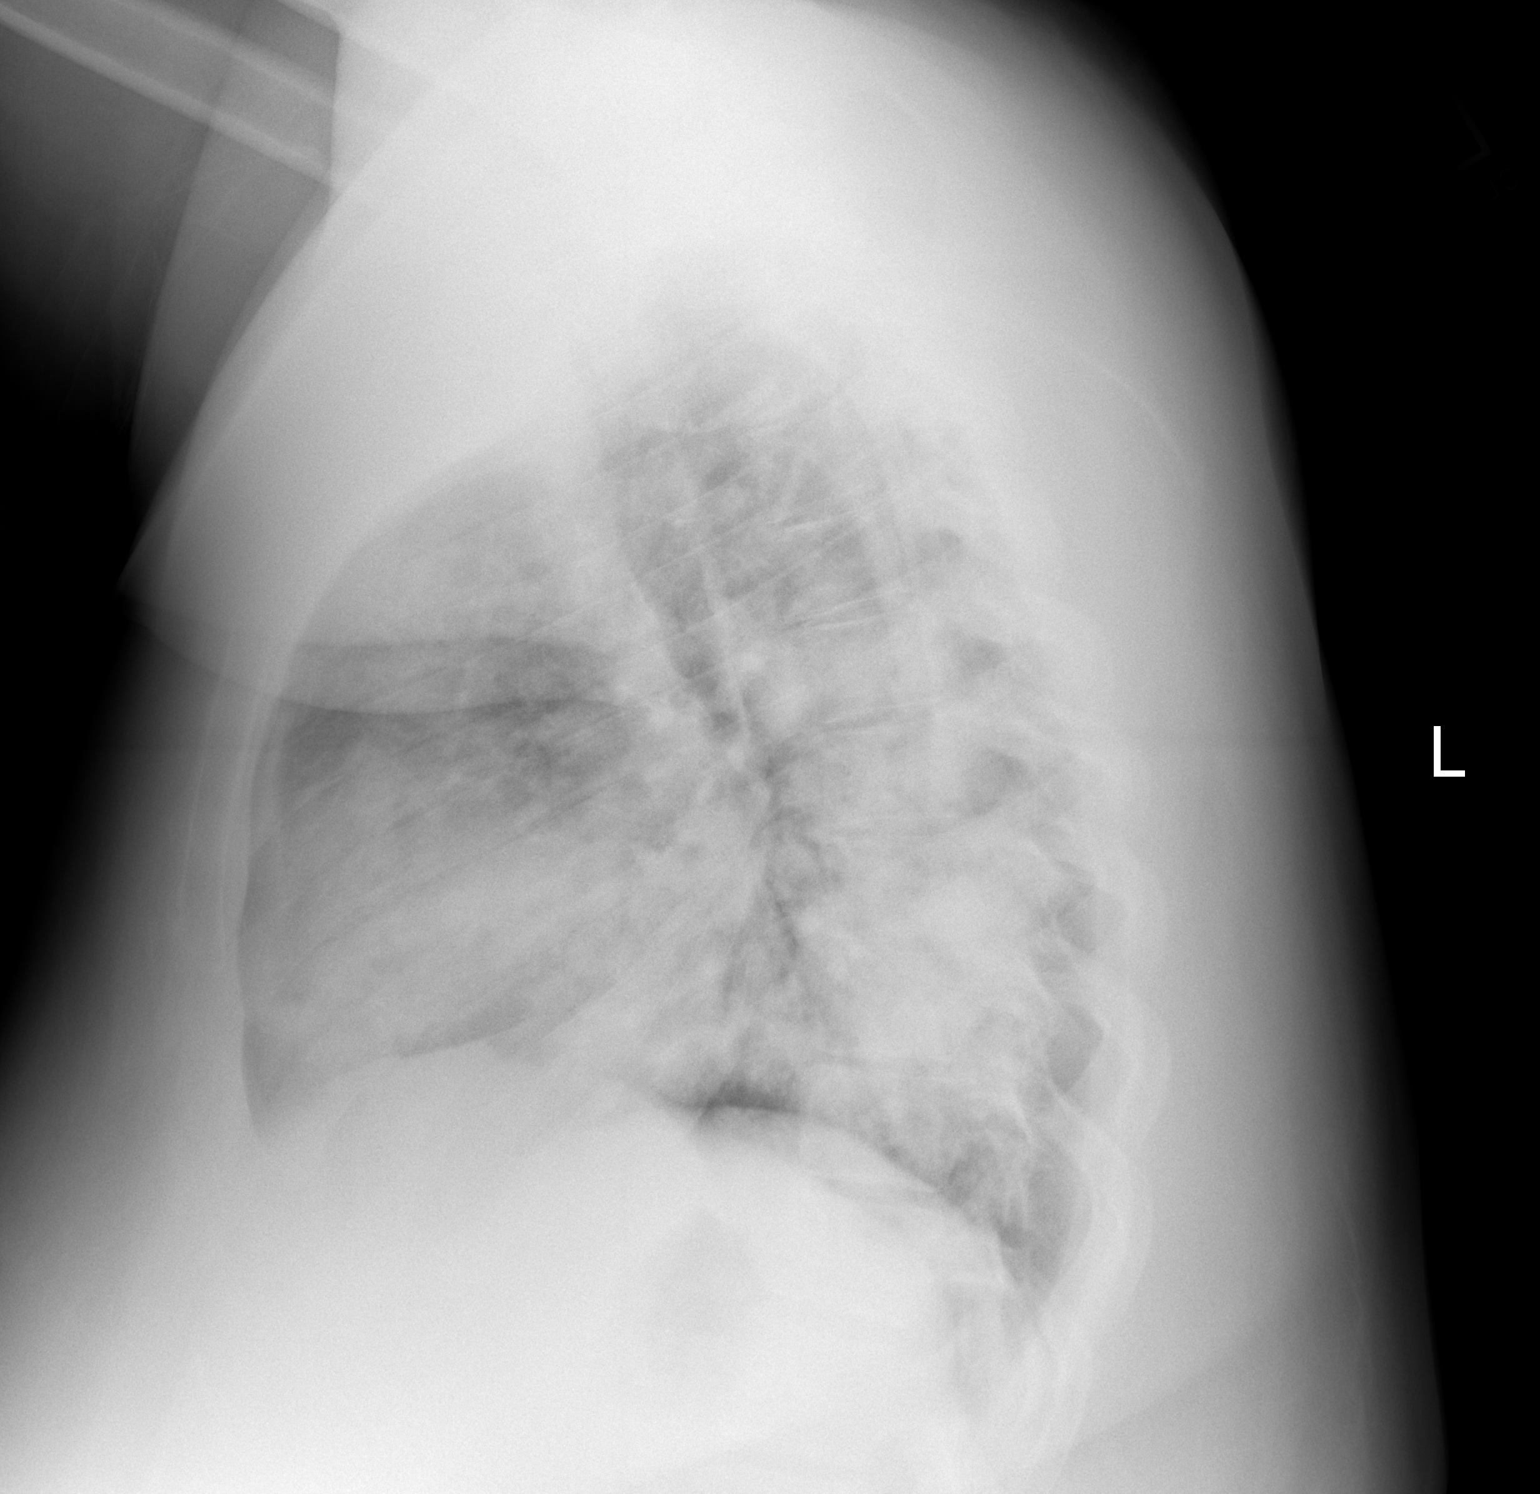

[2 of 2 positions shown; findings below may reference images not displayed]

FINDINGS: The lungs are well-aerated.  There is dense fluffy
opacification of both lung bases, more prominent on the right,
compatible with pneumonia.  There is no evidence of pleural
effusion or pneumothorax.  Mild vascular congestion is seen.

The heart is normal in size; the mediastinal contour is within
normal limits.  No acute osseous abnormalities are seen.
IMPRESSION: 1.  Dense fluffy airspace opacification at the lung bases, more
prominent on the right, compatible with bilateral lower lobe
pneumonia.
2.  Mild vascular congestion noted.

## 2013-07-12 ENCOUNTER — Encounter (HOSPITAL_COMMUNITY): Payer: Self-pay | Admitting: Emergency Medicine

## 2013-07-12 ENCOUNTER — Emergency Department (HOSPITAL_COMMUNITY)
Admission: EM | Admit: 2013-07-12 | Discharge: 2013-07-12 | Disposition: A | Discharge status: Home / Self Care | Payer: Medicaid Other | Attending: Emergency Medicine | Admitting: Emergency Medicine

## 2013-07-12 DIAGNOSIS — Z8701 Personal history of pneumonia (recurrent): Secondary | ICD-10-CM | POA: Insufficient documentation

## 2013-07-12 DIAGNOSIS — Z3202 Encounter for pregnancy test, result negative: Secondary | ICD-10-CM | POA: Insufficient documentation

## 2013-07-12 DIAGNOSIS — Z862 Personal history of diseases of the blood and blood-forming organs and certain disorders involving the immune mechanism: Secondary | ICD-10-CM | POA: Insufficient documentation

## 2013-07-12 DIAGNOSIS — R300 Dysuria: Secondary | ICD-10-CM | POA: Insufficient documentation

## 2013-07-12 DIAGNOSIS — N390 Urinary tract infection, site not specified: Secondary | ICD-10-CM | POA: Insufficient documentation

## 2013-07-12 LAB — URINALYSIS, ROUTINE W REFLEX MICROSCOPIC
Bilirubin Urine: NEGATIVE
Glucose, UA: NEGATIVE mg/dL
Ketones, ur: NEGATIVE mg/dL
Nitrite: POSITIVE — AB
Protein, ur: 100 mg/dL — AB
Specific Gravity, Urine: 1.024 (ref 1.005–1.030)
Urobilinogen, UA: 1 mg/dL (ref 0.0–1.0)
pH: 6 (ref 5.0–8.0)

## 2013-07-12 LAB — URINE MICROSCOPIC-ADD ON

## 2013-07-12 MED ORDER — NITROFURANTOIN MONOHYD MACRO 100 MG PO CAPS: 100.0000 mg | ORAL_CAPSULE | Freq: Two times a day (BID) | ORAL | Status: DC

## 2013-07-12 NOTE — ED Notes (Signed)
S/s of uti x 3 days has been taking otc meds for uti and it is not helping has implant for [facility] has been having spotting also

## 2013-07-12 NOTE — ED Provider Notes (Signed)
Medical screening examination/treatment/procedure(s) were performed by non-physician practitioner and as supervising physician I was immediately available for consultation/collaboration.   Darlys Gales, MD 07/12/13 [phone]

## 2013-07-12 NOTE — ED Provider Notes (Signed)
CSN: [id number]     Arrival date & time 07/12/13  7829 History     First MD Initiated Contact with Patient 07/12/13 [phone]     Chief Complaint  Patient presents with  . Urinary Tract Infection   (Consider location/radiation/quality/duration/timing/severity/associated sxs/prior Treatment) HPI Patient presents to the emergency department with a three-day history of urinary burning and pain.  Patient, states, that she has not had any nausea, vomiting, fever, chest pain, shortness of breath, back pain, abdominal pain, headache, blurred vision, weakness, numbness, or dizziness.  Patient, states she did not take any medications prior to arrival.  Patient, states she's had similar symptoms with previous UTIs.  Patient denies any vaginal bleeding, or discharge Past Medical History  Diagnosis Date  . Pneumonia 03/2011    bilateral, postpartum  . Urinary tract infection     hx of  . Anemia 03/2011    was given a prescription for iron pills but never filled it   Past Surgical History  Procedure Laterality Date  . Cholecystectomy  12/06/2011  . Cholecystectomy  12/06/2011    Procedure: LAPAROSCOPIC CHOLECYSTECTOMY WITH INTRAOPERATIVE CHOLANGIOGRAM;  Surgeon: Adolph Pollack, MD;  Location: [facility] OR;  Service: General;  Laterality: N/A;  Laparoscopic cholecystectomy with intraoperative cholangiogram.   Family History  Problem Relation Age of Onset  . Hypertension Mother   . Anesthesia problems Neg Hx   . Hypotension Neg Hx   . Malignant hyperthermia Neg Hx   . Pseudochol deficiency Neg Hx    History  Substance Use Topics  . Smoking status: Never Smoker   . Smokeless tobacco: Never Used  . Alcohol Use: Yes     Comment: occasionally   OB History   Grav Para Term Preterm Abortions TAB SAB Ect Mult Living                 Review of Systems  Allergies  Review of patient's allergies indicates no known allergies.  Home Medications   Current Outpatient Rx  Name  Route  Sig  Dispense  Refill   . Cranberry-Vitamin C-Probiotic (AZO CRANBERRY PO)   Oral   Take 1 tablet by mouth daily as needed.          BP 118/82  Pulse 88  Temp(Src) 98.5 F (36.9 C) (Oral)  Resp 18  SpO2 100% Physical Exam  Nursing note and vitals reviewed. Constitutional: She is oriented to person, place, and time. She appears well-developed and well-nourished. No distress.  Eyes: Pupils are equal, round, and reactive to light.  Neck: Normal range of motion. Neck supple.  Cardiovascular: Normal rate, regular rhythm and normal heart sounds.  Exam reveals no gallop and no friction rub.   No murmur heard. Pulmonary/Chest: Effort normal and breath sounds normal.  Abdominal: Soft. Bowel sounds are normal. She exhibits no distension. There is no tenderness. There is no rebound and no guarding.  Neurological: She is alert and oriented to person, place, and time.  Skin: Skin is warm and dry. No rash noted. No erythema.    ED Course   Procedures (including critical care time)  Labs Reviewed  URINALYSIS, ROUTINE W REFLEX MICROSCOPIC - Abnormal; Notable for the following:    APPearance CLOUDY (*)    Hgb urine dipstick LARGE (*)    Protein, ur 100 (*)    Nitrite POSITIVE (*)    All other components within normal limits  URINE MICROSCOPIC-ADD ON - Abnormal; Notable for the following:    Squamous Epithelial / LPF FEW (*)  Bacteria, UA MANY (*)    All other components within normal limits  URINE CULTURE  POCT PREGNANCY, URINE   patient be treated for urinary tract infection based on her urinalysis, and her symptoms.  Patient is advised to return here as needed.  Also advised her to follow up with her primary Dr.. she'll be given a [facility]'s information.  Patient is advised return here for any worsening in her condition  MDM    Carlyle Dolly, PA-C 07/12/13 1132

## 2013-07-12 NOTE — ED Notes (Signed)
Patient is alert and orientedx4.  Patient was explained discharge instructions and they understood them with no questions.   

## 2013-07-14 LAB — URINE CULTURE

## 2013-07-18 NOTE — ED Notes (Signed)
+   Urine Culture- treated with appropriate medication per protocol MD. 

## 2013-08-12 ENCOUNTER — Encounter (HOSPITAL_COMMUNITY): Payer: Self-pay | Admitting: *Deleted

## 2013-08-12 ENCOUNTER — Emergency Department (HOSPITAL_COMMUNITY): Payer: Medicaid Other

## 2013-08-12 ENCOUNTER — Emergency Department (HOSPITAL_COMMUNITY)
Admission: EM | Admit: 2013-08-12 | Discharge: 2013-08-13 | Disposition: A | Discharge status: Home / Self Care | Payer: Medicaid Other | Attending: Emergency Medicine | Admitting: Emergency Medicine

## 2013-08-12 DIAGNOSIS — Z87440 Personal history of urinary (tract) infections: Secondary | ICD-10-CM | POA: Insufficient documentation

## 2013-08-12 DIAGNOSIS — Z8701 Personal history of pneumonia (recurrent): Secondary | ICD-10-CM | POA: Insufficient documentation

## 2013-08-12 DIAGNOSIS — R079 Chest pain, unspecified: Secondary | ICD-10-CM | POA: Insufficient documentation

## 2013-08-12 DIAGNOSIS — J069 Acute upper respiratory infection, unspecified: Secondary | ICD-10-CM | POA: Insufficient documentation

## 2013-08-12 DIAGNOSIS — Z862 Personal history of diseases of the blood and blood-forming organs and certain disorders involving the immune mechanism: Secondary | ICD-10-CM | POA: Insufficient documentation

## 2013-08-12 DIAGNOSIS — Z792 Long term (current) use of antibiotics: Secondary | ICD-10-CM | POA: Insufficient documentation

## 2013-08-12 MED ORDER — PROMETHAZINE-DM 6.25-15 MG/5ML PO SYRP: 5.0000 mL | ORAL_SOLUTION | Freq: Four times a day (QID) | ORAL | Status: DC | PRN

## 2013-08-12 MED ORDER — GUAIFENESIN ER 1200 MG PO TB12: 1.0000 | ORAL_TABLET | Freq: Two times a day (BID) | ORAL | Status: DC

## 2013-08-12 MED ORDER — IBUPROFEN 800 MG PO TABS: 800.0000 mg | ORAL_TABLET | Freq: Three times a day (TID) | ORAL | Status: DC | PRN

## 2013-08-12 NOTE — ED Provider Notes (Signed)
CSN: [id number]     Arrival date & time 08/12/13  2200 History   This chart was scribed for non-physician practitioner Charlestine Night, PA-C, working with Flint Melter, MD by Dorothey Baseman, ED Scribe. This patient was seen in room TR06C/TR06C and the patient's care was started at 11:22 PM.    Chief Complaint  Patient presents with  . URI   The history is provided by the patient. No language interpreter was used.   HPI Comments: [name] is a 27 y.o. female who presents to the Emergency Department complaining of an upper respiratory infection onset 4 days ago with associated nasal congestion, rhinorrhea, and productive cough with yellow/clear color sputum. She reports associated intermittent right, upper chest pain onset 3 days ago that is exacerbated with deep inspiration and movement. She states that she has not taken anything at home to treat the symptoms. She denies fever, nausea, vomiting, abdominal pain, or any other symptoms at this time.   Past Medical History  Diagnosis Date  . Pneumonia 03/2011    bilateral, postpartum  . Urinary tract infection     hx of  . Anemia 03/2011    was given a prescription for iron pills but never filled it   Past Surgical History  Procedure Laterality Date  . Cholecystectomy  12/06/2011  . Cholecystectomy  12/06/2011    Procedure: LAPAROSCOPIC CHOLECYSTECTOMY WITH INTRAOPERATIVE CHOLANGIOGRAM;  Surgeon: Adolph Pollack, MD;  Location: [facility] OR;  Service: General;  Laterality: N/A;  Laparoscopic cholecystectomy with intraoperative cholangiogram.   Family History  Problem Relation Age of Onset  . Hypertension Mother   . Anesthesia problems Neg Hx   . Hypotension Neg Hx   . Malignant hyperthermia Neg Hx   . Pseudochol deficiency Neg Hx    History  Substance Use Topics  . Smoking status: Never Smoker   . Smokeless tobacco: Never Used  . Alcohol Use: No   OB History   Grav Para Term Preterm Abortions TAB SAB Ect Mult Living       Review of Systems  A complete 10 system review of systems was obtained and all systems are negative except as noted in the HPI and PMH.   Allergies  Review of patient's allergies indicates no known allergies.  Home Medications   Current Outpatient Rx  Name  Route  Sig  Dispense  Refill  . etonogestrel (IMPLANON) 68 MG IMPL implant   Subcutaneous   Inject 1 each into the skin once.         . nitrofurantoin, macrocrystal-monohydrate, (MACROBID) 100 MG capsule   Oral   Take 1 capsule (100 mg total) by mouth 2 (two) times daily.   14 capsule   0    Triage Vitals: BP 131/87  Pulse 82  Temp(Src) 97.8 F (36.6 C) (Oral)  Resp 14  SpO2 100%  LMP 08/07/2013  Physical Exam  Nursing note and vitals reviewed. Constitutional: She is oriented to person, place, and time. She appears well-developed and well-nourished. No distress.  HENT:  Head: Normocephalic and atraumatic.  Mouth/Throat: Oropharynx is clear and moist.  Eyes: Conjunctivae are normal. Pupils are equal, round, and reactive to light.  Neck: Normal range of motion. Neck supple.  Cardiovascular: Normal rate, regular rhythm and normal heart sounds.   Pulmonary/Chest: Effort normal and breath sounds normal. No respiratory distress. She has no wheezes.  Musculoskeletal: Normal range of motion.  Neurological: She is alert and oriented to person, place, and time.  Skin: Skin  is warm and dry. No rash noted. No erythema.  Psychiatric: She has a normal mood and affect. Her behavior is normal.    ED Course  Procedures (including critical care time)  DIAGNOSTIC STUDIES: Oxygen Saturation is 100% on room air, normal by my interpretation.    COORDINATION OF CARE: 11:24PM- Ordered chest x-rays to rule out possibility of pneumonia. Discussed treatment plan with patient at bedside and patient verbalized agreement.     Labs Review Labs Reviewed - No data to display  Imaging Review Dg Chest 2 View  08/12/2013    CLINICAL DATA:  Cough. URI.  EXAM: CHEST  2 VIEW  COMPARISON:  12/05/2011.  FINDINGS: The heart size and mediastinal contours are within normal limits. Both lungs are clear. The visualized skeletal structures are unremarkable. Cholecystectomy clips.  IMPRESSION: No active cardiopulmonary disease.   Electronically Signed   By: Tiburcio Pea   On: 08/12/2013 22:58   Patient be treated for viral URI.  Told to return here as needed.  Told to increase her fluid intake, and rest as much possible. MDM      Carlyle Dolly, PA-C 08/12/13 [phone]

## 2013-08-12 NOTE — ED Notes (Signed)
Pt reports nasal congestion, productive cough yellow/clear sputum x4 days - pt admits x3 days she has been experiencing rt upper chest pain that hurts worse w/ inspiration and movement. Pt denies any fever.

## 2013-08-13 NOTE — ED Provider Notes (Signed)
Medical screening examination/treatment/procedure(s) were performed by non-physician practitioner and as supervising physician I was immediately available for consultation/collaboration.  Annayah Worthley L Levette Paulick, MD 08/13/13 1518 

## 2014-03-14 ENCOUNTER — Encounter: Payer: Self-pay | Admitting: Family Medicine

## 2014-03-14 ENCOUNTER — Ambulatory Visit (INDEPENDENT_AMBULATORY_CARE_PROVIDER_SITE_OTHER): Payer: Medicaid Other | Admitting: Family Medicine

## 2014-03-14 VITALS — BP 135/80 | HR 76 | Temp 99.7°F | Ht 64.0 in | Wt 303.0 lb

## 2014-03-14 DIAGNOSIS — R1084 Generalized abdominal pain: Secondary | ICD-10-CM

## 2014-03-14 DIAGNOSIS — G8929 Other chronic pain: Secondary | ICD-10-CM

## 2014-03-14 DIAGNOSIS — Z975 Presence of (intrauterine) contraceptive device: Secondary | ICD-10-CM | POA: Insufficient documentation

## 2014-03-14 DIAGNOSIS — E669 Obesity, unspecified: Secondary | ICD-10-CM | POA: Insufficient documentation

## 2014-03-14 NOTE — Patient Instructions (Addendum)
Dear Patricia PitcherSharon Sigmon, Thank you for coming in to clinic today. It was good to meet you!  Today we discussed your Abdominal Pain, Weight and future lifestyle goals. 1. Overall, your abdominal pain does not require any treatment at this time. I believe it could be related to your current weight and deconditioning that we talked about. 2. For your weight, I'm encouraged that you can make good progress with the following goals:    - Start walking for exercise - 2 to 3 days a week, for 20 minutes at a time    - Drink more water (total 2-3 cups). Drink 1 or 2 less sodas, and replace those with glasses of water.    - Decrease portion size of meals, eat smaller more frequent meals, with protein to keep you satisfied.  Diet Recommendations:  Starchy (carb) foods include: Bread, rice, pasta, potatoes, corn, crackers, bagels, muffins, all baked goods.   Protein foods include: Meat, fish, poultry, eggs, dairy foods, and beans such as pinto and kidney beans (beans also provide carbohydrate).   1. Eat at least 3 meals and 1-2 snacks per day. Never go more than 4-5 hours while awake without eating.  2. Limit starchy foods to TWO per meal and ONE per snack. ONE portion of a starchy  food is equal to the following:   - ONE slice of bread (or its equivalent, such as half of a hamburger bun).   - 1/2 cup of a "scoopable" starchy food such as potatoes or rice.   - 1 OUNCE (28 grams) of starchy snack foods such as crackers or pretzels (look on label).   - 15 grams of carbohydrate as shown on food label.  3. Both lunch and dinner should include a protein food, a carb food, and vegetables.   - Obtain twice as many veg's as protein or carbohydrate foods for both lunch and dinner.   - Try to keep frozen veg's on hand for a quick vegetable serving.     - Fresh or frozen veg's are best.  4. Breakfast should always include protein.    Some important numbers from today's visit: BP - 135/80  Please schedule a Lab  Only visit 2 to 4 weeks prior to your follow-up appointment for Lab Work. Please arrive in the morning fasting (without anything to eat or drink since midnight).  Please schedule a follow-up appointment with me in 6 months for follow-up AND a Pap Smear.  If you have any other questions or concerns, please feel free to call the clinic to contact me. You may also schedule an earlier appointment if necessary.  However, if your symptoms get significantly worse, please go to the Emergency Department to seek immediate medical attention.  Saralyn PilarAlexander Romuald Mccaslin, DO [facility]

## 2014-03-14 NOTE — Assessment & Plan Note (Signed)
Current contraception. Located left arm. 3 years, due for replacement August 2015.

## 2014-03-14 NOTE — Assessment & Plan Note (Addendum)
Vague intermittent generalized abdominal complaint. No significant red flag features. Consider GERD related dyspepsia, but unrelated to eating. Suspect deconditioning with weak abdominal wall muscles in setting of obesity, also could be related to menstrual cycle. No significant bowel changes.  Plan: 1. Keep journal and monitor for possible triggers, associations 2. Observe 3. Expect improvement with diet/exercise

## 2014-03-14 NOTE — Assessment & Plan Note (Addendum)
Chronic obesity, with gradual weight gain - BMI >50, super obesity - No prior significant attempts at weight loss - High risk developing DM  Plan: 1. Lifestyle goals:   - Exercise - walking, 2-3 days weekly, 20 min   - Drink 2-3 cups water daily (substitute out 1-2 sodas, dec caffeine / energy drinks)   - Diet - decrease portion size, smaller, frequent meals, (3 regular meals + 2 snacks, protein) 2. Referral to Dr. Gerilyn PilgrimSykes for MNT / Meal planning for obesity 3. Future CMET, Lipid panel - screening for DM and hyperlipidemia 4. RTC 6 months

## 2014-03-14 NOTE — Progress Notes (Signed)
Subjective:     Patient ID: Patricia SarnaSharon M Resende, female   DOB: 1986/10/24, 28 y.o.   MRN: [id number]  Patient presents to establish as a new patient visit.  HPI  ABDOMINAL PAIN: - Currently without pain. Reports chronic intermittent abdominal pain for past 1 year, described bilateral lower abdominal ache that occurs 4-5x days in a row, then resolves, occurs monthly, worse when wakes up and then gets better throughout the day. - Nothing improves it or makes it worse - Denies any change in bowel habits. No diarrhea or constipation. No blood in stool. Denies nausea, vomiting. Denies any association to menstrual cycle.  - Denies worsening with spicy foods. No complaints at night.  OBESITY: - Concerned about her current weight (gain +20 lbs in 2 years), and interested in losing weight. No formal attempt at weight loss in past, never started diet. - Admits to gradual weight gain since [employer], worse in college due to stress, and recent pregnancy - Currently no regular exercise program. Exercise of choice would be walking - Diet - many frozen and packaged foods. Drinks multiple diet sodas and energy drinks daily, less than 1 glass of water daily,   MOLE - Spot on top of head. Reported to have been there for about 10 years. Itching, and will hurt if she scratches it. - Denies any recent change or growth  PMH: Hospitalized for PNA 2012  OB / GYN: - LMP 1 week ago - G1P1  Surgical Hx: Cholecystectomy 2013  Family Hx: - Cousin and Aunt dx with Colorectal Cancer (age < 2750)  I have reviewed and updated the following as appropriate: allergies and current medications  Social Hx: Never smoker. Infrequent alcohol use. - Single - Employed part-time at [employer] - College education [employer]  Review of Systems  See above HPI.     Objective:   Physical Exam  BP 135/80  Pulse 76  Temp(Src) 99.7 F (37.6 C) (Oral)  Ht 5\' 4"  (1.626 m)  Wt 303 lb (137.44 kg)  BMI 51.98 kg/m2  LMP  03/07/2014  Gen - obese, pleasant, well-appearing, NAD HEENT - MMM Neck - supple, non-tender, no thyromegaly Heart - RRR, no murmurs heard Lungs - CTAB, no wheezing, crackles, or rhonchi. Normal work of breathing. Abd - soft, NTND, no masses, +active BS Ext - non-tender, no edema, peripheral pulses intact +2 b/l Skin - warm, dry, no rashes. Top left scalp 1cm x 1cm pale nevus symmetrical and intact without erythema Neuro - awake, alert, oriented, grossly non-focal, intact muscle strength 5/5 b/l, intact distal sensation to light touch, gait normal     Assessment:     See specific A&P problem list for details.      Plan:     See specific A&P problem list for details.

## 2014-09-05 ENCOUNTER — Encounter (HOSPITAL_COMMUNITY): Payer: Self-pay | Admitting: Emergency Medicine

## 2014-09-05 ENCOUNTER — Emergency Department (HOSPITAL_COMMUNITY): Payer: Medicaid Other

## 2014-09-05 DIAGNOSIS — R5383 Other fatigue: Secondary | ICD-10-CM | POA: Insufficient documentation

## 2014-09-05 DIAGNOSIS — Z862 Personal history of diseases of the blood and blood-forming organs and certain disorders involving the immune mechanism: Secondary | ICD-10-CM | POA: Insufficient documentation

## 2014-09-05 DIAGNOSIS — Z3202 Encounter for pregnancy test, result negative: Secondary | ICD-10-CM | POA: Insufficient documentation

## 2014-09-05 DIAGNOSIS — Z79899 Other long term (current) drug therapy: Secondary | ICD-10-CM | POA: Insufficient documentation

## 2014-09-05 DIAGNOSIS — Z8701 Personal history of pneumonia (recurrent): Secondary | ICD-10-CM | POA: Insufficient documentation

## 2014-09-05 DIAGNOSIS — Z87440 Personal history of urinary (tract) infections: Secondary | ICD-10-CM | POA: Insufficient documentation

## 2014-09-05 LAB — I-STAT CHEM 8, ED
BUN: 19 mg/dL (ref 6–23)
CHLORIDE: 106 meq/L (ref 96–112)
CREATININE: 0.8 mg/dL (ref 0.50–1.10)
Calcium, Ion: 1.14 mmol/L (ref 1.12–1.23)
GLUCOSE: 92 mg/dL (ref 70–99)
HEMATOCRIT: 34 % — AB (ref 36.0–46.0)
Hemoglobin: 11.6 g/dL — ABNORMAL LOW (ref 12.0–15.0)
POTASSIUM: 4.3 meq/L (ref 3.7–5.3)
Sodium: 139 mEq/L (ref 137–147)
TCO2: 25 mmol/L (ref 0–100)

## 2014-09-05 LAB — CBC
HCT: 31.9 % — ABNORMAL LOW (ref 36.0–46.0)
HEMOGLOBIN: 10.1 g/dL — AB (ref 12.0–15.0)
MCH: 25.7 pg — AB (ref 26.0–34.0)
MCHC: 31.7 g/dL (ref 30.0–36.0)
MCV: 81.2 fL (ref 78.0–100.0)
Platelets: 363 10*3/uL (ref 150–400)
RBC: 3.93 MIL/uL (ref 3.87–5.11)
RDW: 15.8 % — ABNORMAL HIGH (ref 11.5–15.5)
WBC: 6.3 10*3/uL (ref 4.0–10.5)

## 2014-09-05 LAB — COMPREHENSIVE METABOLIC PANEL
ALK PHOS: 59 U/L (ref 39–117)
ALT: 10 U/L (ref 0–35)
ANION GAP: 12 (ref 5–15)
AST: 14 U/L (ref 0–37)
Albumin: 3.3 g/dL — ABNORMAL LOW (ref 3.5–5.2)
BUN: 16 mg/dL (ref 6–23)
CHLORIDE: 105 meq/L (ref 96–112)
CO2: 23 meq/L (ref 19–32)
Calcium: 8.5 mg/dL (ref 8.4–10.5)
Creatinine, Ser: 0.79 mg/dL (ref 0.50–1.10)
GLUCOSE: 94 mg/dL (ref 70–99)
POTASSIUM: 4.2 meq/L (ref 3.7–5.3)
Sodium: 140 mEq/L (ref 137–147)
TOTAL PROTEIN: 7.2 g/dL (ref 6.0–8.3)

## 2014-09-05 LAB — DIFFERENTIAL
Basophils Absolute: 0 10*3/uL (ref 0.0–0.1)
Basophils Relative: 0 % (ref 0–1)
EOS ABS: 0.1 10*3/uL (ref 0.0–0.7)
Eosinophils Relative: 1 % (ref 0–5)
LYMPHS ABS: 2.2 10*3/uL (ref 0.7–4.0)
LYMPHS PCT: 34 % (ref 12–46)
MONOS PCT: 10 % (ref 3–12)
Monocytes Absolute: 0.6 10*3/uL (ref 0.1–1.0)
NEUTROS PCT: 55 % (ref 43–77)
Neutro Abs: 3.4 10*3/uL (ref 1.7–7.7)

## 2014-09-05 LAB — I-STAT TROPONIN, ED: Troponin i, poc: 0 ng/mL (ref 0.00–0.08)

## 2014-09-05 NOTE — ED Notes (Signed)
Patient states she has been feeling tired and weak for the last two to three days.  She denies any nausea or vomiting.  She states that she does have some hot flashes than feeling cold.  She states that she just continues having a feeling of weakness.

## 2014-09-06 ENCOUNTER — Emergency Department (HOSPITAL_COMMUNITY)
Admission: EM | Admit: 2014-09-06 | Discharge: 2014-09-06 | Disposition: A | Discharge status: Home / Self Care | Payer: Medicaid Other | Attending: Emergency Medicine | Admitting: Emergency Medicine

## 2014-09-06 DIAGNOSIS — R5383 Other fatigue: Secondary | ICD-10-CM

## 2014-09-06 LAB — URINALYSIS, ROUTINE W REFLEX MICROSCOPIC
BILIRUBIN URINE: NEGATIVE
Glucose, UA: NEGATIVE mg/dL
HGB URINE DIPSTICK: NEGATIVE
Ketones, ur: NEGATIVE mg/dL
Leukocytes, UA: NEGATIVE
NITRITE: NEGATIVE
PH: 6 (ref 5.0–8.0)
Protein, ur: NEGATIVE mg/dL
SPECIFIC GRAVITY, URINE: 1.038 — AB (ref 1.005–1.030)
Urobilinogen, UA: 1 mg/dL (ref 0.0–1.0)

## 2014-09-06 LAB — PREGNANCY, URINE: Preg Test, Ur: NEGATIVE

## 2014-09-06 LAB — TSH: TSH: 2.65 u[IU]/mL (ref 0.350–4.500)

## 2014-09-06 NOTE — ED Notes (Signed)
[facility]. Pt alert, NAD, calm, interactive, skin W&D, resps e/u, speaking in clear complete sentences, VSS, c/o general weakness and hot flashes.

## 2014-09-06 NOTE — Discharge Instructions (Signed)
Your workup today has not shown a specific cause for your fatigue.  Your hemoglobin level was slightly low.  If you had been on iron in the past, it might help to restart your iron tablets.  Rest, drink plenty of fluids, and try to get 20-30 minutes of exercise a day to help counteract your fatigue.  Follow up with your doctor for recheck in 3-5 days.  Return to the ER for worsening condition or new concerning symptoms.   Fatigue Fatigue is a feeling of tiredness, lack of energy, lack of motivation, or feeling tired all the time. Having enough rest, good nutrition, and reducing stress will normally reduce fatigue. Consult your caregiver if it persists. The nature of your fatigue will help your caregiver to find out its cause. The treatment is based on the cause.  CAUSES  There are many causes for fatigue. Most of the time, fatigue can be traced to one or more of your habits or routines. Most causes fit into one or more of three general areas. They are: Lifestyle problems  Sleep disturbances.  Overwork.  Physical exertion.  Unhealthy habits.  Poor eating habits or eating disorders.  Alcohol and/or drug use .  Lack of proper nutrition (malnutrition). Psychological problems  Stress and/or anxiety problems.  Depression.  Grief.  Boredom. Medical Problems or Conditions  Anemia.  Pregnancy.  Thyroid gland problems.  Recovery from major surgery.  Continuous pain.  Emphysema or asthma that is not well controlled  Allergic conditions.  Diabetes.  Infections (such as mononucleosis).  Obesity.  Sleep disorders, such as sleep apnea.  Heart failure or other heart-related problems.  Cancer.  Kidney disease.  Liver disease.  Effects of certain medicines such as antihistamines, cough and cold remedies, prescription pain medicines, heart and blood pressure medicines, drugs used for treatment of cancer, and some antidepressants. SYMPTOMS  The symptoms of fatigue  include:   Lack of energy.  Lack of drive (motivation).  Drowsiness.  Feeling of indifference to the surroundings. DIAGNOSIS  The details of how you feel help guide your caregiver in finding out what is causing the fatigue. You will be asked about your present and past health condition. It is important to review all medicines that you take, including prescription and non-prescription items. A thorough exam will be done. You will be questioned about your feelings, habits, and normal lifestyle. Your caregiver may suggest blood tests, urine tests, or other tests to look for common medical causes of fatigue.  TREATMENT  Fatigue is treated by correcting the underlying cause. For example, if you have continuous pain or depression, treating these causes will improve how you feel. Similarly, adjusting the dose of certain medicines will help in reducing fatigue.  HOME CARE INSTRUCTIONS   Try to get the required amount of good sleep every night.  Eat a healthy and nutritious diet, and drink enough water throughout the day.  Practice ways of relaxing (including yoga or meditation).  Exercise regularly.  Make plans to change situations that cause stress. Act on those plans so that stresses decrease over time. Keep your work and personal routine reasonable.  Avoid street drugs and minimize use of alcohol.  Start taking a daily multivitamin after consulting your caregiver. SEEK MEDICAL CARE IF:   You have persistent tiredness, which cannot be accounted for.  You have fever.  You have unintentional weight loss.  You have headaches.  You have disturbed sleep throughout the night.  You are feeling sad.  You have constipation.  You have dry skin.  You have gained weight.  You are taking any new or different medicines that you suspect are causing fatigue.  You are unable to sleep at night.  You develop any unusual swelling of your legs or other parts of your body. SEEK IMMEDIATE  MEDICAL CARE IF:   You are feeling confused.  Your vision is blurred.  You feel faint or pass out.  You develop severe headache.  You develop severe abdominal, pelvic, or back pain.  You develop chest pain, shortness of breath, or an irregular or fast heartbeat.  You are unable to pass a normal amount of urine.  You develop abnormal bleeding such as bleeding from the rectum or you vomit blood.  You have thoughts about harming yourself or committing suicide.  You are worried that you might harm someone else. MAKE SURE YOU:   Understand these instructions.  Will watch your condition.  Will get help right away if you are not doing well or get worse. Document Released: 09/11/2007 Document Revised: 02/06/2012 Document Reviewed: 03/18/2014 [facility] Patient Information 2015 [place], [place]. This information is not intended to replace advice given to you by your health care provider. Make sure you discuss any questions you have with your health care provider.

## 2014-09-06 NOTE — ED Provider Notes (Signed)
CSN: [id number]     Arrival date & time 09/05/14  2248 History   First MD Initiated Contact with Patient 09/06/14 0402     Chief Complaint  Patient presents with  . Fatigue  . Weakness     (Consider location/radiation/quality/duration/timing/severity/associated sxs/prior Treatment) HPI [age] you female presents to the ER from home with complaint of generalized weakness and fatigue for the last 2-3 days.  Pt reports when she wakes up she is still tired and feels like she should go back to bed.  Despite sleeping more, she remains tired.  No focal weakness, headache, fever, chills, n/v/d.  Pt reports she has implanon, but she is overdue for renewal.  Unknown LMP.  No urinary symptoms.  Occasional hot/cold flashes.  No sick contacts.  No prior h/o same.  No weight loss/gain. Past Medical History  Diagnosis Date  . Pneumonia 03/2011    bilateral, postpartum  . Urinary tract infection     hx of  . Anemia 03/2011    was given a prescription for iron pills but never filled it   Past Surgical History  Procedure Laterality Date  . Cholecystectomy  12/06/2011  . Cholecystectomy  12/06/2011    Procedure: LAPAROSCOPIC CHOLECYSTECTOMY WITH INTRAOPERATIVE CHOLANGIOGRAM;  Surgeon: Adolph Pollackodd J Rosenbower, MD;  [facility];  Service: General;  Laterality: N/A;  Laparoscopic cholecystectomy with intraoperative cholangiogram.   Family History  Problem Relation Age of Onset  . Hypertension Mother   . Diabetes Mother 7557  . Anesthesia problems Neg Hx   . Hypotension Neg Hx   . Malignant hyperthermia Neg Hx   . Pseudochol deficiency Neg Hx   . Cancer Cousin 30    Colorectal Cancer   History  Substance Use Topics  . Smoking status: Never Smoker   . Smokeless tobacco: Never Used  . Alcohol Use: No   OB History   Grav Para Term Preterm Abortions TAB SAB Ect Mult Living                 Review of Systems  All other systems reviewed and are negative.     Allergies  Review of patient's allergies  indicates no known allergies.  Home Medications   Prior to Admission medications   Medication Sig Start Date End Date Taking? Authorizing Provider  etonogestrel (IMPLANON) 68 MG IMPL implant Inject 1 each into the skin once.   Yes Historical Provider, MD  loratadine (CLARITIN) 10 MG tablet Take 10 mg by mouth daily.   Yes Historical Provider, MD   BP 125/77  Pulse 70  Temp(Src) 98.8 F (37.1 C) (Oral)  Resp 20  Ht 5\' 4"  (1.626 m)  Wt 284 lb (128.822 kg)  BMI 48.72 kg/m2  SpO2 100%  LMP 08/22/2014 Physical Exam  Nursing note and vitals reviewed. Constitutional: She is oriented to person, place, and time. She appears well-developed and well-nourished.  HENT:  Head: Normocephalic and atraumatic.  Right Ear: External ear normal.  Left Ear: External ear normal.  Nose: Nose normal.  Mouth/Throat: Oropharynx is clear and moist.  Eyes: Conjunctivae and EOM are normal. Pupils are equal, round, and reactive to light.  Neck: Normal range of motion. Neck supple. No JVD present. No tracheal deviation present. No thyromegaly present.  Cardiovascular: Normal rate, regular rhythm, normal heart sounds and intact distal pulses.  Exam reveals no gallop and no friction rub.   No murmur heard. Pulmonary/Chest: Effort normal and breath sounds normal. No stridor. No respiratory distress. She has no wheezes.  She has no rales. She exhibits no tenderness.  Abdominal: Soft. Bowel sounds are normal. She exhibits no distension and no mass. There is no tenderness. There is no rebound and no guarding.  Musculoskeletal: Normal range of motion. She exhibits no edema and no tenderness.  Lymphadenopathy:    She has no cervical adenopathy.  Neurological: She is alert and oriented to person, place, and time. She displays normal reflexes. No cranial nerve deficit. She exhibits normal muscle tone. Coordination normal.  Skin: Skin is warm and dry. No rash noted. No erythema. No pallor.  Psychiatric: She has a normal  mood and affect. Her behavior is normal. Judgment and thought content normal.    ED Course  Procedures (including critical care time) Labs Review Labs Reviewed  URINALYSIS, ROUTINE W REFLEX MICROSCOPIC - Abnormal; Notable for the following:    APPearance CLOUDY (*)    Specific Gravity, Urine 1.038 (*)    All other components within normal limits  CBC - Abnormal; Notable for the following:    Hemoglobin 10.1 (*)    HCT 31.9 (*)    MCH 25.7 (*)    RDW 15.8 (*)    All other components within normal limits  COMPREHENSIVE METABOLIC PANEL - Abnormal; Notable for the following:    Albumin 3.3 (*)    Total Bilirubin <0.2 (*)    All other components within normal limits  I-STAT CHEM 8, ED - Abnormal; Notable for the following:    Hemoglobin 11.6 (*)    HCT 34.0 (*)    All other components within normal limits  DIFFERENTIAL  TSH  PREGNANCY, URINE  CBG MONITORING, ED  POC URINE PREG, ED  I-STAT TROPOININ, ED    Imaging Review Ct Head Wo Contrast  09/06/2014   CLINICAL DATA:  Tiredness and diffuse weakness for 2-3 days. Initial encounter.  EXAM: CT HEAD WITHOUT CONTRAST  TECHNIQUE: Contiguous axial images were obtained from the base of the skull through the vertex without intravenous contrast.  COMPARISON:  None.  FINDINGS: There is no evidence of acute infarction, mass lesion, or intra- or extra-axial hemorrhage on CT.  The posterior fossa, including the cerebellum, brainstem and fourth ventricle, is within normal limits. The third and lateral ventricles, and basal ganglia are unremarkable in appearance. The cerebral hemispheres are symmetric in appearance, with normal gray-white differentiation. No mass effect or midline shift is seen.  There is no evidence of fracture; visualized osseous structures are unremarkable in appearance. The orbits are within normal limits. The paranasal sinuses and mastoid air cells are well-aerated. No significant soft tissue abnormalities are seen.  IMPRESSION:  Unremarkable noncontrast CT of the head.   Electronically Signed   By: Roanna RaiderJeffery  Chang M.D.   On: 09/06/2014 00:33     EKG Interpretation None      MDM   Final diagnoses:  Other fatigue    28 yo female with fatigue for 2-3 days, generalized weakness.  No signs of infection, pt is not pregnant, no reported depression, no other specific cause identified in her w/u for fatigue.  Pt is not in distress.  Pt to f/u with her pcm.    Olivia Mackielga M Lasasha Brophy, MD 09/07/14 [id number]

## 2014-10-08 ENCOUNTER — Other Ambulatory Visit: Payer: Medicaid Other

## 2014-10-17 ENCOUNTER — Ambulatory Visit (INDEPENDENT_AMBULATORY_CARE_PROVIDER_SITE_OTHER): Payer: Self-pay | Admitting: Family Medicine

## 2014-10-17 ENCOUNTER — Other Ambulatory Visit (HOSPITAL_COMMUNITY)
Admission: RE | Admit: 2014-10-17 | Discharge: 2014-10-17 | Disposition: A | Discharge status: Home / Self Care | Payer: Medicaid Other | Source: Ambulatory Visit | Attending: Family Medicine | Admitting: Family Medicine

## 2014-10-17 ENCOUNTER — Encounter: Payer: Self-pay | Admitting: Family Medicine

## 2014-10-17 VITALS — BP 117/72 | HR 68 | Temp 98.4°F | Ht 64.0 in | Wt 277.9 lb

## 2014-10-17 DIAGNOSIS — D509 Iron deficiency anemia, unspecified: Secondary | ICD-10-CM

## 2014-10-17 DIAGNOSIS — R5383 Other fatigue: Secondary | ICD-10-CM | POA: Insufficient documentation

## 2014-10-17 DIAGNOSIS — Z01419 Encounter for gynecological examination (general) (routine) without abnormal findings: Secondary | ICD-10-CM | POA: Insufficient documentation

## 2014-10-17 DIAGNOSIS — Z975 Presence of (intrauterine) contraceptive device: Secondary | ICD-10-CM

## 2014-10-17 DIAGNOSIS — Z124 Encounter for screening for malignant neoplasm of cervix: Secondary | ICD-10-CM

## 2014-10-17 DIAGNOSIS — R5382 Chronic fatigue, unspecified: Secondary | ICD-10-CM

## 2014-10-17 LAB — CBC
HCT: 35.8 % — ABNORMAL LOW (ref 36.0–46.0)
Hemoglobin: 11.5 g/dL — ABNORMAL LOW (ref 12.0–15.0)
MCH: 26.3 pg (ref 26.0–34.0)
MCHC: 32.1 g/dL (ref 30.0–36.0)
MCV: 81.9 fL (ref 78.0–100.0)
MPV: 9.6 fL (ref 9.4–12.4)
Platelets: 361 10*3/uL (ref 150–400)
RBC: 4.37 MIL/uL (ref 3.87–5.11)
RDW: 17.2 % — ABNORMAL HIGH (ref 11.5–15.5)
WBC: 5.6 10*3/uL (ref 4.0–10.5)

## 2014-10-17 NOTE — Patient Instructions (Signed)
Dear [name], Thank you for coming in to clinic today.  1. For your Fatigue - we checked some labs - blood count and iron panel, you do have history of iron deficiency, so this is most likely the cause. 2. Recommend continue Iron Supplement 325mg  tablet with food 2 times daily. Take Metamucil (fiber supplement) daily, or if still no relief for constipation, try Miralax OTC 3. Pap smear performed today, we will notify you of results. If normal, not due again for 3 years  Call with results of labs and pap smear  Please schedule appointment to have Nexplanon contraception device removed and replaced - Schedule this with Dr. Althea CharonKaramalegos, or any other available provider - Or recommend scheduling this with - "[facility]" - Dr. Jennette KettleNeal or Dr. Lum BabeEniola - [facility]  Please schedule a follow-up appointment with Dr. Althea CharonKaramalegos in 3 to 6 months  If you have any other questions or concerns, please feel free to call the clinic to contact me. You may also schedule an earlier appointment if necessary.  However, if your symptoms get significantly worse, please go to the Emergency Department to seek immediate medical attention.  Saralyn PilarAlexander Karamalegos, DO [facility]   Iron Deficiency Anemia Anemia is a condition in which there are less red blood cells or hemoglobin in the blood than normal. Hemoglobin is the part of red blood cells that carries oxygen. Iron deficiency anemia is anemia caused by too little iron. It is the most common type of anemia. It may leave you tired and short of breath. CAUSES   Lack of iron in the diet.  Poor absorption of iron, as seen with intestinal disorders.  Intestinal bleeding.  Heavy periods. SIGNS AND SYMPTOMS  Mild anemia may not be noticeable. Symptoms may include:  Fatigue.  Headache.  Pale skin.  Weakness.  Tiredness.  Shortness of breath.  Dizziness.  Cold hands and feet.  Fast or irregular  heartbeat. DIAGNOSIS  Diagnosis requires a thorough evaluation and physical exam by your health care provider. Blood tests are generally used to confirm iron deficiency anemia. Additional tests may be done to find the underlying cause of your anemia. These may include:  Testing for blood in the stool (fecal occult blood test).  A procedure to see inside the colon and rectum (colonoscopy).  A procedure to see inside the esophagus and stomach (endoscopy). TREATMENT  Iron deficiency anemia is treated by correcting the cause of the deficiency. Treatment may involve:  Adding iron-rich foods to your diet.  Taking iron supplements. Pregnant or breastfeeding women need to take extra iron because their normal diet usually does not provide the required amount.  Taking vitamins. Vitamin C improves the absorption of iron. Your health care provider may recommend that you take your iron tablets with a glass of orange juice or vitamin C supplement.  Medicines to make heavy menstrual flow lighter.  Surgery. HOME CARE INSTRUCTIONS   Take iron as directed by your health care provider.  If you cannot tolerate taking iron supplements by mouth, talk to your health care provider about taking them through a vein (intravenously) or an injection into a muscle.  For the best iron absorption, iron supplements should be taken on an empty stomach. If you cannot tolerate them on an empty stomach, you may need to take them with food.  Do not drink milk or take antacids at the same time as your iron supplements. Milk and antacids may interfere with the  absorption of iron.  Iron supplements can cause constipation. Make sure to include fiber in your diet to prevent constipation. A stool softener may also be recommended.  Take vitamins as directed by your health care provider.  Eat a diet rich in iron. Foods high in iron include liver, lean beef, whole-grain bread, eggs, dried fruit, and dark green leafy  vegetables. SEEK IMMEDIATE MEDICAL CARE IF:   You faint. If this happens, do not drive. Call your local emergency services (911 in [place] if no other help is available.  You have chest pain.  You feel nauseous or vomit.  You have severe or increased shortness of breath with activity.  You feel weak.  You have a rapid heartbeat.  You have unexplained sweating.  You become light-headed when getting up from a chair or bed. MAKE SURE YOU:   Understand these instructions.  Will watch your condition.  Will get help right away if you are not doing well or get worse. Document Released: 11/11/2000 Document Revised: 11/19/2013 Document Reviewed: 07/22/2013 [facility] Patient Information 2015 [place], [place]. This information is not intended to replace advice given to you by your health care provider. Make sure you discuss any questions you have with your health care provider.

## 2014-10-17 NOTE — Progress Notes (Signed)
   Subjective:    Patient ID: Patricia SarnaSharon M Panuco, female    DOB: 1986-09-21, 28 y.o.   MRN: [id number]  HPI  FATIGUE / Hx Chronic Iron Deficiency Anemia - Chronic hx previously dx iron deficiency anemia during hospitalization for PNA in 2012 - Reports recently October 2015, with felt tired, weak, increased sleeping, went to ED for evaluation, thought hx chronic anemia might be playing a role, but overall work-up negative, with Hgb 10.6 - Currently resolved, no symptoms. - Taking OTC Iron supplement 325mg  daily with food - Denies any fatigue, chest pain or tightness, SOB / DOE, headache, lightheadedness, pre-syncope / syncope, rectal bleeding or blood in stool  GYN / OB Hx: - Reports regular periods every month, average flow over 5-7 days (some spotting during that time) - LMP: 09/22/14 - G1P1 (last 2011, SVD) - Contraception: Nexplanon insert (L arm, placed 06/2011) - due for removal / replace since 06/2014 - Currently sexually active, 1 partner, no condoms, no hx STD or recent exposure   HM: - Due for pap smear today (cannot recall last one)  I have reviewed and updated the following as appropriate: allergies and current medications  Social Hx: - Never smoker  Review of Systems  See above HPI    Objective:   Physical Exam  BP 117/72 mmHg  Pulse 68  Temp(Src) 98.4 F (36.9 C) (Oral)  Ht 5\' 4"  (1.626 m)  Wt 277 lb 14.4 oz (126.055 kg)  BMI 47.68 kg/m2  LMP 09/22/2014  Gen - obese, well-appearing, pleasant, NAD HEENT - MMM Neck - supple, no thyromegaly Heart - RRR, no murmurs heard Lungs - CTAB. Normal work of breathing. Ext - non-tender, no edema, peripheral pulses intact +2 b/l Skin - warm, dry, no rashes Neuro - awake, alert, grossly non-focal Pelvic Exam - Normal external female genitalia. Vaginal canal without lesions. Normal appearing cervix, without lesions or bleeding. Minimal physiologic discharge on exam. Bimanual exam without masses or cervical motion  tenderness. - Pap smear collected  Chaperoned by Maree Erieeseree Blount.     Assessment & Plan:   See specific A&P problem list for details.

## 2014-10-17 NOTE — Assessment & Plan Note (Signed)
Past due for Nexplanon removal / replacement (06/2014)  Plan: 1. Strongly advised regular condom use until replacement 2. Recommend scheduling f/u to remove / replace device in Left arm as soon as possible, can schedule with [facility] if needed

## 2014-10-17 NOTE — Assessment & Plan Note (Addendum)
Pap smear performed today (10/17/14), no visualized cervical abnormalities - Last pap (pt can't recall)

## 2014-10-17 NOTE — Assessment & Plan Note (Signed)
Currently resolved, hx intermittent episodes of fatigue, weakness, most recently 1 month ago. In setting of known chronic iron deficiency anemia - No significant red flag symptoms associated. Regular menstrual cycle - ED 08/2014 - labs otherwise unremarkable, Hgb 10.6, TSH nml  Plan: 1. Re-check CBC, Ferritin, Iron and TIBC 2. Ferrous Sulfate 325mg  PO BID with meals, if not covered can continue OTC 3. Recommend inc fiber, metamucil, can try Miralax PRN constipation 4. RTC 3-6 months

## 2014-10-17 NOTE — Assessment & Plan Note (Signed)
Chronic hx, currently asymptomatic, recently with weakness / fatigue - No significant red flag symptoms associated. Regular menstrual cycle - ED 08/2014 - labs otherwise unremarkable, Hgb 10.6, TSH nml  Plan: 1. Re-check CBC, Ferritin, Iron and TIBC 2. Ferrous Sulfate 325mg  PO BID with meals, if not covered can continue OTC 3. Recommend inc fiber, metamucil, can try Miralax PRN constipation 4. RTC 3-6 months

## 2014-10-18 LAB — IRON AND TIBC
%SAT: 8 % — ABNORMAL LOW (ref 20–55)
IRON: 31 ug/dL — AB (ref 42–145)
TIBC: 366 ug/dL (ref 250–470)
UIBC: 335 ug/dL (ref 125–400)

## 2014-10-18 LAB — FERRITIN: Ferritin: 14 ng/mL (ref 10–291)

## 2014-10-20 LAB — CYTOLOGY - PAP

## 2015-11-09 IMAGING — CT CT HEAD W/O CM
1 series · 16 of 30 positions shown, 20 images · non-contrast
Comparison: None.

CLINICAL DATA: Tiredness and diffuse weakness for 2-3 days. Initial
encounter.

EXAM:
CT HEAD WITHOUT CONTRAST
TECHNIQUE: Contiguous axial images were obtained from the base of the skull
through the vertex without intravenous contrast.

[Series 2: head 5.0 h30s · axial · 0.40mm/px · z∈[-139,-4]mm · 16 of 31 slices shown, 20 images]
[im 2/31  brain]
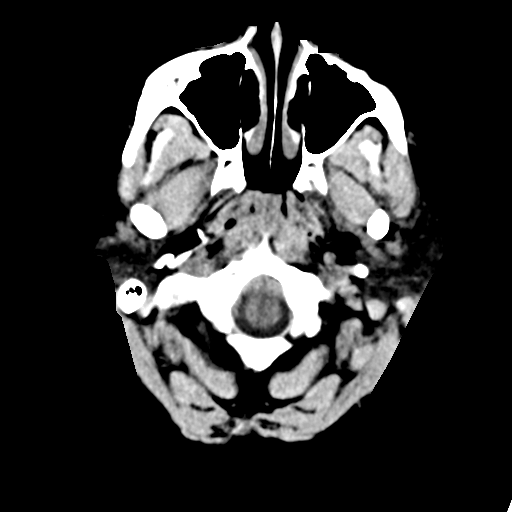
[im 2/31  bone]
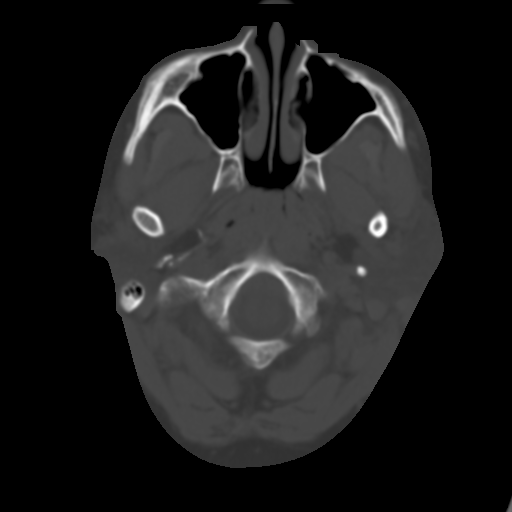
[im 4/31  brain]
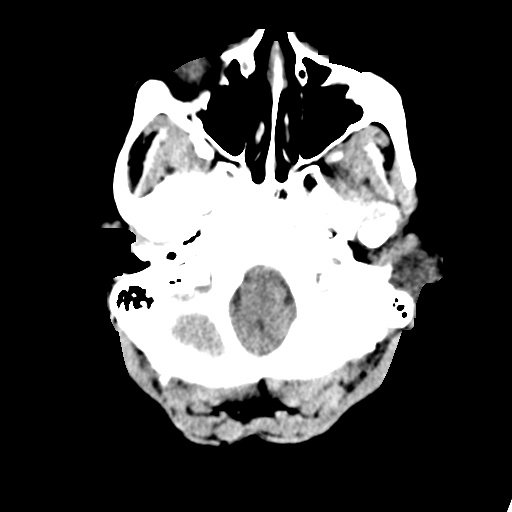
[im 6/31  brain]
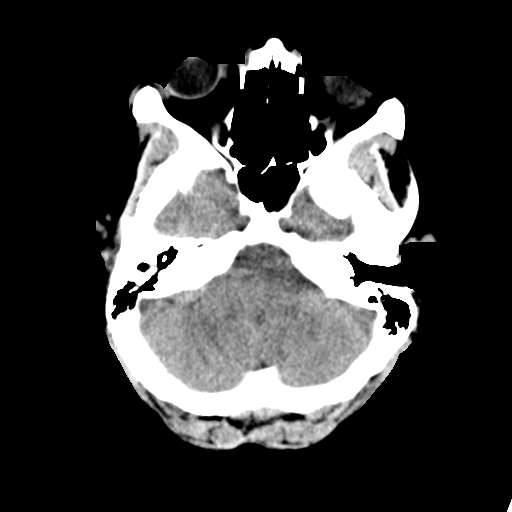
[im 8/31  brain]
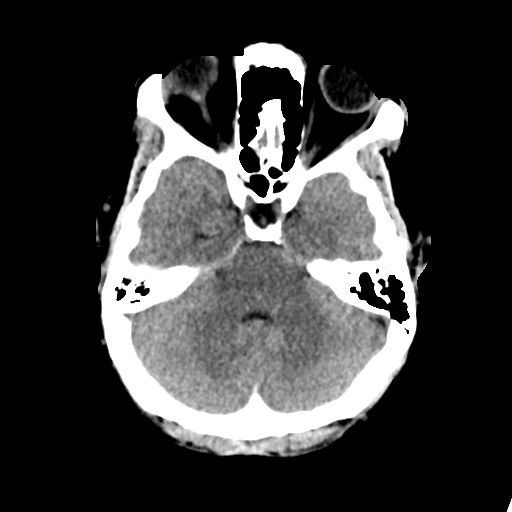
[im 9/31  brain]
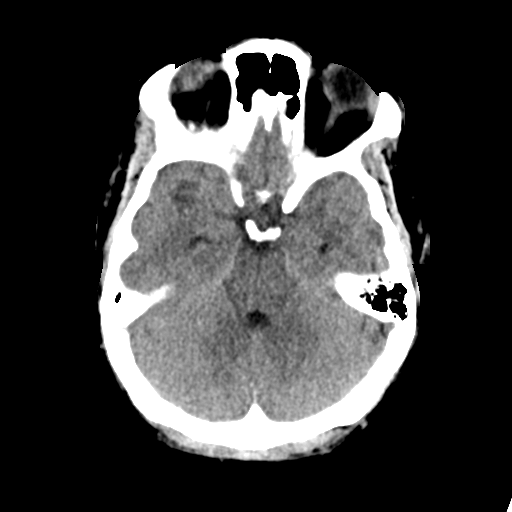
[im 9/31  bone]
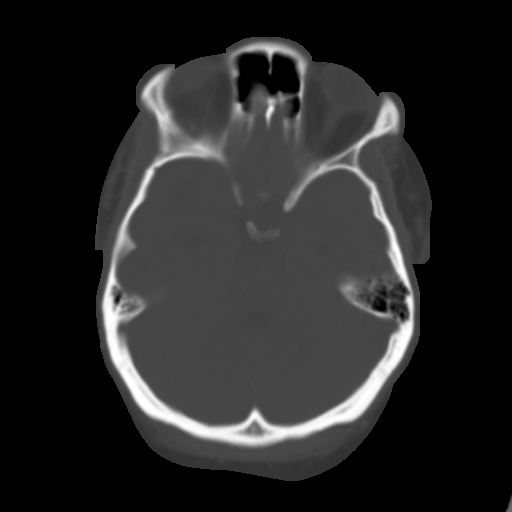
[im 11/31  brain]
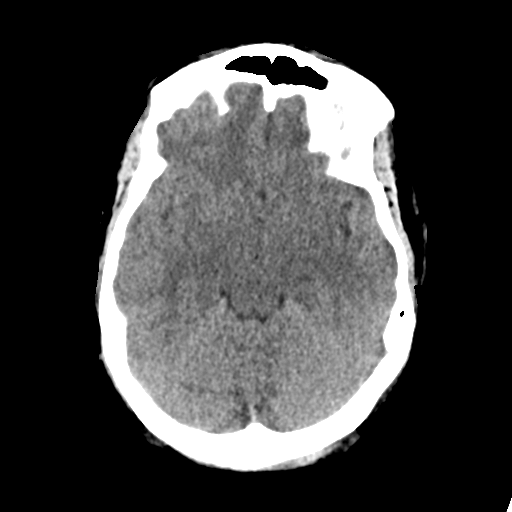
[im 13/31  brain]
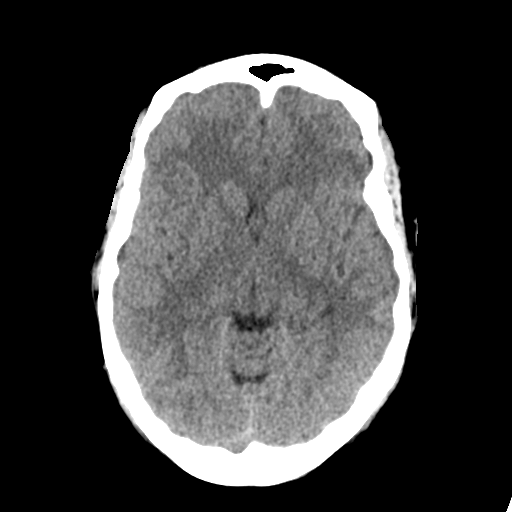
[im 15/31  brain]
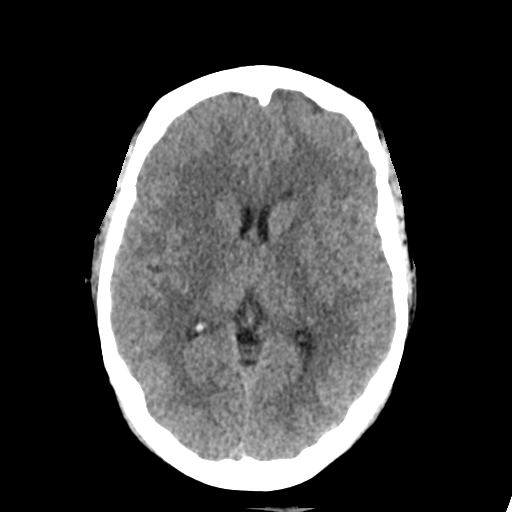
[im 16/31  brain]
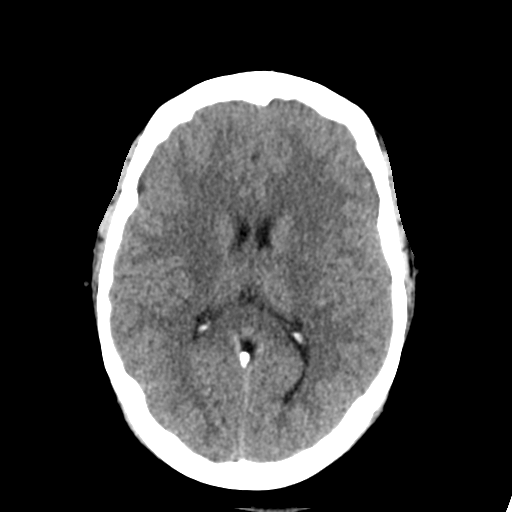
[im 16/31  bone]
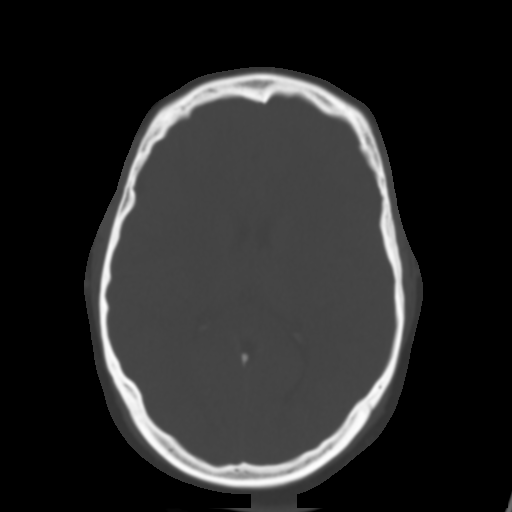
[im 18/31  brain]
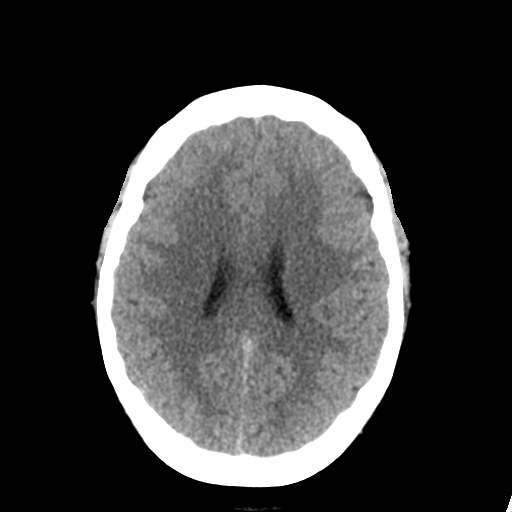
[im 20/31  brain]
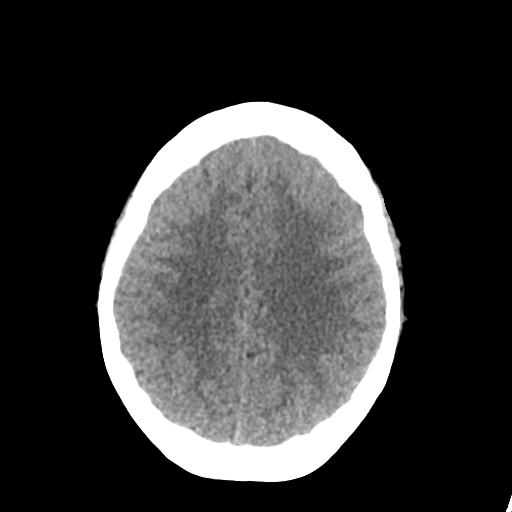
[im 22/31  brain]
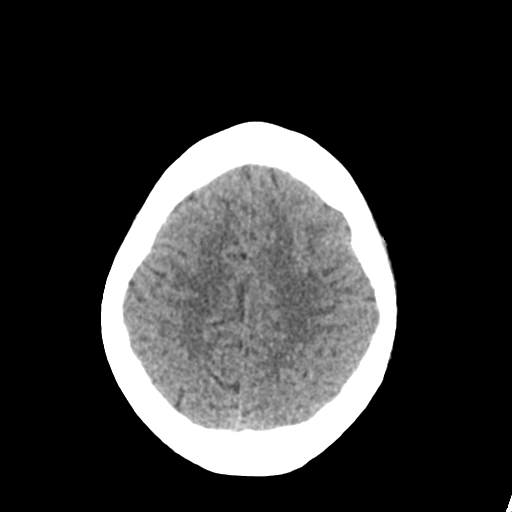
[im 23/31  brain]
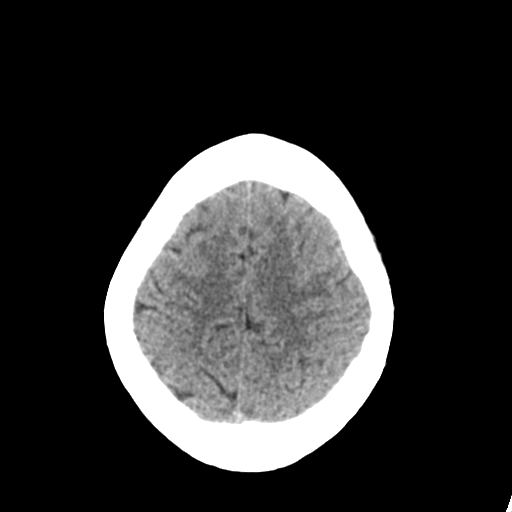
[im 23/31  bone]
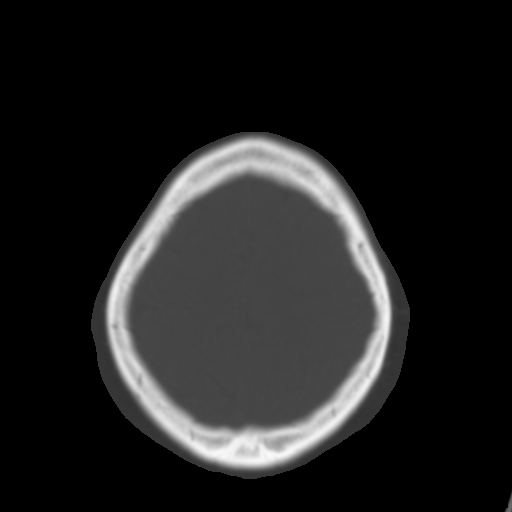
[im 25/31  brain]
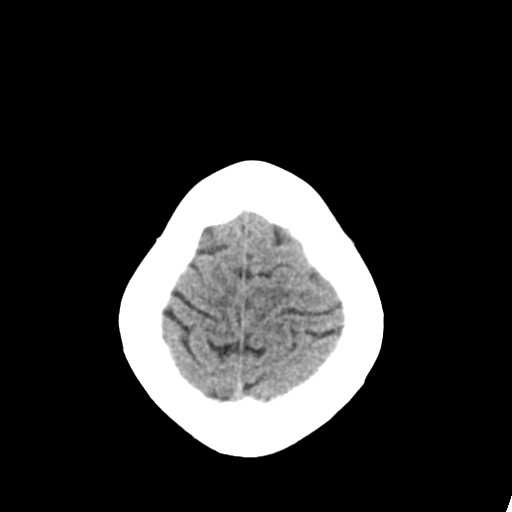
[im 27/31  brain]
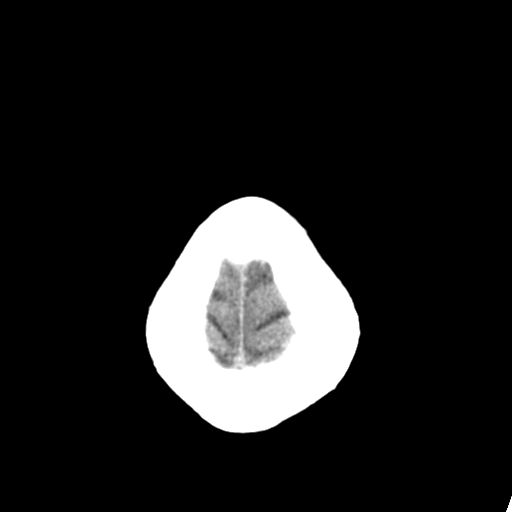
[im 29/31  brain]
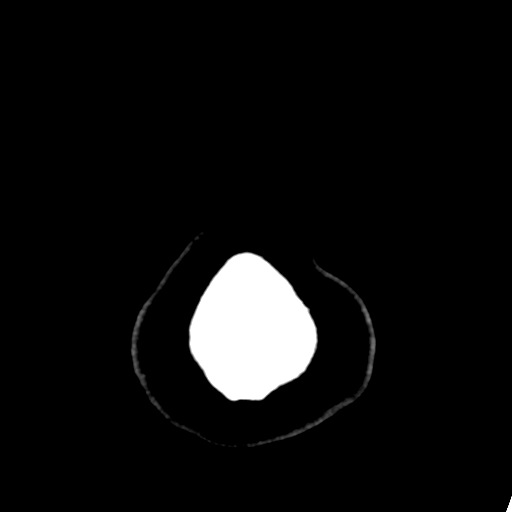

[16 of 30 positions shown; findings below may reference images not displayed]

FINDINGS: There is no evidence of acute infarction, mass lesion, or intra- or
extra-axial hemorrhage on CT.

The posterior fossa, including the cerebellum, brainstem and fourth
ventricle, is within normal limits. The third and lateral
ventricles, and basal ganglia are unremarkable in appearance. The
cerebral hemispheres are symmetric in appearance, with normal
gray-white differentiation. No mass effect or midline shift is seen.

There is no evidence of fracture; visualized osseous structures are
unremarkable in appearance. The orbits are within normal limits. The
paranasal sinuses and mastoid air cells are well-aerated. No
significant soft tissue abnormalities are seen.
IMPRESSION: Unremarkable noncontrast CT of the head.
